# Patient Record
Sex: Male | Born: 1960 | Race: White | Hispanic: No | Marital: Married | Smoking: Former smoker
Health system: Southern US, Community
[De-identification: ages and names within clinical notes are randomized; demographics above are authoritative.]

## PROBLEM LIST (undated history)

## (undated) DIAGNOSIS — C801 Malignant (primary) neoplasm, unspecified: Secondary | ICD-10-CM

## (undated) DIAGNOSIS — I10 Essential (primary) hypertension: Secondary | ICD-10-CM

## (undated) DIAGNOSIS — Z9989 Dependence on other enabling machines and devices: Secondary | ICD-10-CM

## (undated) DIAGNOSIS — F4310 Post-traumatic stress disorder, unspecified: Secondary | ICD-10-CM

## (undated) DIAGNOSIS — K219 Gastro-esophageal reflux disease without esophagitis: Secondary | ICD-10-CM

## (undated) DIAGNOSIS — Z8709 Personal history of other diseases of the respiratory system: Secondary | ICD-10-CM

## (undated) DIAGNOSIS — C61 Malignant neoplasm of prostate: Secondary | ICD-10-CM

## (undated) DIAGNOSIS — S8990XA Unspecified injury of unspecified lower leg, initial encounter: Secondary | ICD-10-CM

## (undated) DIAGNOSIS — Z87820 Personal history of traumatic brain injury: Secondary | ICD-10-CM

## (undated) DIAGNOSIS — E785 Hyperlipidemia, unspecified: Secondary | ICD-10-CM

## (undated) DIAGNOSIS — Z0282 Encounter for adoption services: Secondary | ICD-10-CM

## (undated) DIAGNOSIS — T7840XA Allergy, unspecified, initial encounter: Secondary | ICD-10-CM

## (undated) DIAGNOSIS — Z973 Presence of spectacles and contact lenses: Secondary | ICD-10-CM

## (undated) HISTORY — PX: OTHER SURGICAL HISTORY: SHX169

## (undated) HISTORY — DX: Allergy, unspecified, initial encounter: T78.40XA

## (undated) HISTORY — DX: Essential (primary) hypertension: I10

## (undated) HISTORY — PX: POLYPECTOMY: SHX149

## (undated) HISTORY — PX: TONSILLECTOMY: SUR1361

## (undated) HISTORY — DX: Gastro-esophageal reflux disease without esophagitis: K21.9

## (undated) HISTORY — PX: WISDOM TOOTH EXTRACTION: SHX21

## (undated) HISTORY — PX: GANGLION CYST EXCISION: SHX1691

## (undated) HISTORY — DX: Hyperlipidemia, unspecified: E78.5

## (undated) HISTORY — DX: Malignant (primary) neoplasm, unspecified: C80.1

---
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
## 1998-12-16 ENCOUNTER — Encounter: Payer: Self-pay | Admitting: Endocrinology

## 1998-12-16 ENCOUNTER — Emergency Department (HOSPITAL_COMMUNITY): Admission: EM | Admit: 1998-12-16 | Discharge: 1998-12-16 | Payer: Self-pay | Admitting: Emergency Medicine

## 2009-01-10 DIAGNOSIS — Z8679 Personal history of other diseases of the circulatory system: Secondary | ICD-10-CM

## 2009-01-10 HISTORY — DX: Personal history of other diseases of the circulatory system: Z86.79

## 2009-04-15 ENCOUNTER — Emergency Department (HOSPITAL_COMMUNITY): Admission: EM | Admit: 2009-04-15 | Discharge: 2009-04-15 | Payer: Self-pay | Admitting: Emergency Medicine

## 2011-04-10 ENCOUNTER — Encounter: Payer: Self-pay | Admitting: Gastroenterology

## 2011-05-09 ENCOUNTER — Encounter: Payer: Self-pay | Admitting: Gastroenterology

## 2011-05-09 ENCOUNTER — Ambulatory Visit (AMBULATORY_SURGERY_CENTER): Payer: 59 | Admitting: *Deleted

## 2011-05-09 VITALS — Ht 72.0 in | Wt 215.0 lb

## 2011-05-09 DIAGNOSIS — Z1211 Encounter for screening for malignant neoplasm of colon: Secondary | ICD-10-CM

## 2011-05-09 MED ORDER — PEG-KCL-NACL-NASULF-NA ASC-C 100 G PO SOLR: ORAL | Status: DC

## 2011-05-23 ENCOUNTER — Encounter: Payer: Self-pay | Admitting: Gastroenterology

## 2011-05-23 ENCOUNTER — Ambulatory Visit (AMBULATORY_SURGERY_CENTER): Payer: 59 | Admitting: Gastroenterology

## 2011-05-23 DIAGNOSIS — D126 Benign neoplasm of colon, unspecified: Secondary | ICD-10-CM

## 2011-05-23 DIAGNOSIS — Z1211 Encounter for screening for malignant neoplasm of colon: Secondary | ICD-10-CM

## 2011-05-23 HISTORY — PX: COLONOSCOPY: SHX174

## 2011-05-23 MED ORDER — SODIUM CHLORIDE 0.9 % IV SOLN: 500.0000 mL | INTRAVENOUS | Status: DC

## 2011-05-23 NOTE — Patient Instructions (Signed)
Resume all medications.Information given on polyps.D/C Information completed.

## 2011-05-24 ENCOUNTER — Telehealth: Payer: Self-pay | Admitting: *Deleted

## 2011-05-24 NOTE — Telephone Encounter (Signed)
Follow up Call- Patient questions:  Do you have a fever, pain , or abdominal swelling? no Pain Score  0 *  Have you tolerated food without any problems? yes  Have you been able to return to your normal activities? yes  Do you have any questions about your discharge instructions: Diet   no Medications  no Follow up visit  no  Do you have questions or concerns about your Care? yes  Actions: * If pain score is 4 or above: No action needed, pain <4.  Patient states "having diarrhea this morning" he denies any other symptoms or problems,explained to patient this may be from prep but should not be excessive or last long time, explained to call us back if not better. He understands.

## 2011-05-25 ENCOUNTER — Encounter: Payer: Self-pay | Admitting: Gastroenterology

## 2011-05-25 ENCOUNTER — Other Ambulatory Visit: Payer: Self-pay | Admitting: Gastroenterology

## 2011-05-25 MED ORDER — HYOSCYAMINE SULFATE ER 0.375 MG PO TBCR: EXTENDED_RELEASE_TABLET | ORAL | Status: DC

## 2011-05-25 NOTE — Progress Notes (Signed)
  Patient called stating that he is having very mild right lower quadrant pain. 2 days ago he had a colonoscopy with cold removal of several small polyps. He is without fever. He denies any bleeding.  Patient was instructed to take I am asked as needed for pain. If pain is not improved or if it is worsening over the next 24 hours he is instructed to call back.

## 2011-08-26 ENCOUNTER — Ambulatory Visit (INDEPENDENT_AMBULATORY_CARE_PROVIDER_SITE_OTHER): Payer: 59 | Admitting: Emergency Medicine

## 2011-08-26 VITALS — BP 130/90 | HR 85 | Temp 98.4°F | Resp 18 | Ht 73.0 in | Wt 217.0 lb

## 2011-08-26 DIAGNOSIS — R5381 Other malaise: Secondary | ICD-10-CM

## 2011-08-26 DIAGNOSIS — R5383 Other fatigue: Secondary | ICD-10-CM

## 2011-08-26 DIAGNOSIS — R531 Weakness: Secondary | ICD-10-CM

## 2011-08-26 LAB — POCT CBC
Granulocyte percent: 62.6 %G (ref 37–80)
Lymph, poc: 3.3 (ref 0.6–3.4)
MCHC: 32 g/dL (ref 31.8–35.4)
MCV: 101.2 fL — AB (ref 80–97)
MID (cbc): 1.1 — AB (ref 0–0.9)
POC Granulocyte: 7.4 — AB (ref 2–6.9)
POC LYMPH PERCENT: 27.8 %L (ref 10–50)
Platelet Count, POC: 336 10*3/uL (ref 142–424)
RDW, POC: 13.8 %

## 2011-08-26 LAB — POCT URINALYSIS DIPSTICK
Bilirubin, UA: NEGATIVE
Blood, UA: NEGATIVE
Glucose, UA: NEGATIVE
Ketones, UA: NEGATIVE
Leukocytes, UA: NEGATIVE
Nitrite, UA: NEGATIVE
Protein, UA: NEGATIVE
Spec Grav, UA: 1.02
Urobilinogen, UA: 0.2
pH, UA: 5.5

## 2011-08-26 LAB — POCT SEDIMENTATION RATE: POCT SED RATE: 45 mm/hr — AB (ref 0–22)

## 2011-08-26 LAB — GLUCOSE, POCT (MANUAL RESULT ENTRY): POC Glucose: 81

## 2011-08-26 NOTE — Patient Instructions (Signed)
Stay hydrated.  Drink water, alternating with Gatorade.  Rest as much as you can.  Work note written for two days.  Do not take Atacand until we contact you with lab results tomorrow.  Nausea and Vomiting Nausea is a sick feeling that often comes before throwing up (vomiting). Vomiting is a reflex where stomach contents come out of your mouth. Vomiting can cause severe loss of body fluids (dehydration). Children and elderly adults can become dehydrated quickly, especially if they also have diarrhea. Nausea and vomiting are symptoms of a condition or disease. It is important to find the cause of your symptoms. CAUSES   Direct irritation of the stomach lining. This irritation can result from increased acid production (gastroesophageal reflux disease), infection, food poisoning, taking certain medicines (such as nonsteroidal anti-inflammatory drugs), alcohol use, or tobacco use.   Signals from the brain.These signals could be caused by a headache, heat exposure, an inner ear disturbance, increased pressure in the brain from injury, infection, a tumor, or a concussion, pain, emotional stimulus, or metabolic problems.   An obstruction in the gastrointestinal tract (bowel obstruction).   Illnesses such as diabetes, hepatitis, gallbladder problems, appendicitis, kidney problems, cancer, sepsis, atypical symptoms of a heart attack, or eating disorders.   Medical treatments such as chemotherapy and radiation.   Receiving medicine that makes you sleep (general anesthetic) during surgery.  DIAGNOSIS Your caregiver may ask for tests to be done if the problems do not improve after a few days. Tests may also be done if symptoms are severe or if the reason for the nausea and vomiting is not clear. Tests may include:  Urine tests.   Blood tests.   Stool tests.   Cultures (to look for evidence of infection).   X-rays or other imaging studies.  Test results can help your caregiver make decisions about  treatment or the need for additional tests. TREATMENT You need to stay well hydrated. Drink frequently but in small amounts.You may wish to drink water, sports drinks, clear broth, or eat frozen ice pops or gelatin dessert to help stay hydrated.When you eat, eating slowly may help prevent nausea.There are also some antinausea medicines that may help prevent nausea. HOME CARE INSTRUCTIONS   Take all medicine as directed by your caregiver.   If you do not have an appetite, do not force yourself to eat. However, you must continue to drink fluids.   If you have an appetite, eat a normal diet unless your caregiver tells you differently.   Eat a variety of complex carbohydrates (rice, wheat, potatoes, bread), lean meats, yogurt, fruits, and vegetables.   Avoid high-fat foods because they are more difficult to digest.   Drink enough water and fluids to keep your urine clear or pale yellow.   If you are dehydrated, ask your caregiver for specific rehydration instructions. Signs of dehydration may include:   Severe thirst.   Dry lips and mouth.   Dizziness.   Dark urine.   Decreasing urine frequency and amount.   Confusion.   Rapid breathing or pulse.  SEEK IMMEDIATE MEDICAL CARE IF:   You have blood or brown flecks (like coffee grounds) in your vomit.   You have black or bloody stools.   You have a severe headache or stiff neck.   You are confused.   You have severe abdominal pain.   You have chest pain or trouble breathing.   You do not urinate at least once every 8 hours.   You develop cold  or clammy skin.   You continue to vomit for longer than 24 to 48 hours.   You have a fever.  MAKE SURE YOU:   Understand these instructions.   Will watch your condition.   Will get help right away if you are not doing well or get worse.  Document Released: 07/01/2005 Document Revised: 03/13/2011 Document Reviewed: 11/28/2010 [facility] Patient Information 2012 [place],  [place].

## 2011-08-26 NOTE — Progress Notes (Signed)
  Subjective:    Patient ID: Jerome Newton, male    DOB: January 24, 1961, 51 y.o.   MRN: [id number]  HPI 51 y/o WM presents with multiple vague symptoms Fatigue and weakness ongoing for two weeks.  Slight cough one week ago. Last week developed nausea and vomiting.  Called primary MD who prescribed Zofran without appointment.  The following day had dry heaves but did not vomit.  Has not vomited now in several days, but suffering with constipation.  No abdominal pain.  No fevers or chills.    Now with dull headache, feels weak, lightheaded.  Slight chills, still no fever.  Cough rare, non-productive.  No nasal congestion, but with lots of post-nasal drip.  No GU symptoms other than usual (urinary hesitancy, weak stream d/t indolent prostate cancer).     Review of Systems  Constitutional: Positive for chills and fatigue. Negative for fever.  HENT: Positive for postnasal drip. Negative for ear pain, nosebleeds, congestion, sore throat, sneezing, neck pain and neck stiffness.   Respiratory: Positive for cough. Negative for shortness of breath and wheezing.   Cardiovascular: Negative for chest pain.  Gastrointestinal: Positive for nausea and constipation. Negative for abdominal pain, diarrhea and blood in stool.  Genitourinary: Positive for difficulty urinating (h/o indolent prostate CA, managed by urology). Negative for frequency and discharge.  Musculoskeletal: Positive for back pain (intermittent, non-radiating).  Skin: Positive for rash (chronic eczema, seborrheic dermatitis).  Neurological: Positive for light-headedness (upon standing).       Objective:   Physical Exam  Constitutional: He is oriented to person, place, and time. Vital signs are normal. He appears lethargic. He appears ill. No distress.  HENT:  Head: Normocephalic and atraumatic.  Right Ear: External ear normal.  Left Ear: External ear normal.  Mouth/Throat: Oropharynx is clear and moist. No oropharyngeal exudate.  Eyes:  Conjunctivae and EOM are normal. Pupils are equal, round, and reactive to light.  Neck: Normal range of motion. Neck supple.  Cardiovascular: Normal rate and regular rhythm.  Exam reveals no gallop and no friction rub.   No murmur heard. Pulmonary/Chest: Effort normal and breath sounds normal. No respiratory distress. He has no wheezes. He has no rales.  Abdominal: Soft. There is tenderness (mild, LLQ). There is no rebound and no guarding.  Lymphadenopathy:    He has no cervical adenopathy.  Neurological: He is oriented to person, place, and time. He appears lethargic.  Skin: Skin is warm. Rash (face with desquamated rash with underlying erythema, history of eczema and seborrheic dermatitis) noted.  Psychiatric: His affect is blunt.          Assessment & Plan:  Recent nausea and vomiting.  Resolved, but with residual weakness.  He is on an ACE-inhibitor and had recent GI illness, will screen CMET for abnormal renal function d/t pre-renal azotemia.  Will also run TSH and ESR d/t prolonged fatigue and weakness.  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  Note out of work for two days to recover.

## 2011-08-27 LAB — COMPREHENSIVE METABOLIC PANEL
BUN: 14 mg/dL (ref 6–23)
CO2: 26 mEq/L (ref 19–32)
Calcium: 10.2 mg/dL (ref 8.4–10.5)
Chloride: 102 mEq/L (ref 96–112)
Creat: 1 mg/dL (ref 0.50–1.35)
Glucose, Bld: 89 mg/dL (ref 70–99)
Total Bilirubin: 0.4 mg/dL (ref 0.3–1.2)

## 2011-08-27 LAB — TSH: TSH: 1.534 u[IU]/mL (ref 0.350–4.500)

## 2011-10-11 ENCOUNTER — Ambulatory Visit (INDEPENDENT_AMBULATORY_CARE_PROVIDER_SITE_OTHER): Payer: 59 | Admitting: Emergency Medicine

## 2011-10-11 ENCOUNTER — Ambulatory Visit: Payer: 59

## 2011-10-11 VITALS — BP 119/81 | HR 76 | Temp 98.1°F | Resp 16 | Ht 72.75 in | Wt 218.2 lb

## 2011-10-11 DIAGNOSIS — R509 Fever, unspecified: Secondary | ICD-10-CM

## 2011-10-11 DIAGNOSIS — R05 Cough: Secondary | ICD-10-CM

## 2011-10-11 DIAGNOSIS — J029 Acute pharyngitis, unspecified: Secondary | ICD-10-CM

## 2011-10-11 DIAGNOSIS — R110 Nausea: Secondary | ICD-10-CM

## 2011-10-11 DIAGNOSIS — J40 Bronchitis, not specified as acute or chronic: Secondary | ICD-10-CM

## 2011-10-11 LAB — POCT CBC
Lymph, poc: 2.2 (ref 0.6–3.4)
MCH, POC: 32.9 pg — AB (ref 27–31.2)
MCHC: 32.7 g/dL (ref 31.8–35.4)
MCV: 100.4 fL — AB (ref 80–97)
MID (cbc): 0.9 (ref 0–0.9)
POC LYMPH PERCENT: 21.6 %L (ref 10–50)
POC MID %: 8.2 %M (ref 0–12)
Platelet Count, POC: 362 10*3/uL (ref 142–424)
RBC: 4.41 M/uL — AB (ref 4.69–6.13)
RDW, POC: 13.9 %
WBC: 10.4 10*3/uL — AB (ref 4.6–10.2)

## 2011-10-11 MED ORDER — AZITHROMYCIN 250 MG PO TABS: ORAL_TABLET | ORAL | Status: AC

## 2011-10-11 MED ORDER — BENZONATATE 200 MG PO CAPS: 200.0000 mg | ORAL_CAPSULE | Freq: Three times a day (TID) | ORAL | Status: AC | PRN

## 2011-10-11 NOTE — Progress Notes (Signed)
  Subjective:    Patient ID: Jerome Newton, male    DOB: 10-Dec-1960, 51 y.o.   MRN: [id number]  HPIThe patient is a 51 year old male who started to feel bad on Tuesday with irritated throat and head congestion. He doesn't have a fever but complains he is nauseous and diarrhea. No vomiting, no ear pain, but prominent cough on exam. HE has tried OTC medicines with no success.      Review of Systems as related to the present illness     Objective:   Physical Exam  Constitutional: He appears well-developed and well-nourished.  HENT:  Right Ear: External ear normal.  Left Ear: External ear normal.       Patient is status post tonsillectomy. There is mild redness in the posterior pharynx.  Eyes: Pupils are equal, round, and reactive to light.  Neck: No tracheal deviation present. No thyromegaly present.  Cardiovascular: Normal rate, regular rhythm and normal heart sounds.   Pulmonary/Chest: Breath sounds normal. No stridor. No respiratory distress. He has no wheezes. He has no rales.  Abdominal: He exhibits no distension. There is no tenderness.  Lymphadenopathy:    He has no cervical adenopathy.    [facility] reading (PRIMARY) by  Dr.Laquashia Mergenthaler chest x-ray shows mild increased markings but no consolidated areas heart size normal. .       Assessment & Plan:  Patient has had a flu shot but has a flulike illness. We'll check a strep test CBC chest x-ray.

## 2011-11-21 ENCOUNTER — Ambulatory Visit (INDEPENDENT_AMBULATORY_CARE_PROVIDER_SITE_OTHER): Payer: 59 | Admitting: Emergency Medicine

## 2011-11-21 ENCOUNTER — Ambulatory Visit: Payer: 59

## 2011-11-21 VITALS — BP 122/75 | HR 71 | Temp 97.9°F | Resp 16 | Ht 73.0 in | Wt 218.0 lb

## 2011-11-21 DIAGNOSIS — Z8546 Personal history of malignant neoplasm of prostate: Secondary | ICD-10-CM

## 2011-11-21 DIAGNOSIS — J9801 Acute bronchospasm: Secondary | ICD-10-CM

## 2011-11-21 DIAGNOSIS — R059 Cough, unspecified: Secondary | ICD-10-CM

## 2011-11-21 DIAGNOSIS — E785 Hyperlipidemia, unspecified: Secondary | ICD-10-CM

## 2011-11-21 DIAGNOSIS — R05 Cough: Secondary | ICD-10-CM

## 2011-11-21 DIAGNOSIS — R918 Other nonspecific abnormal finding of lung field: Secondary | ICD-10-CM

## 2011-11-21 DIAGNOSIS — J159 Unspecified bacterial pneumonia: Secondary | ICD-10-CM

## 2011-11-21 DIAGNOSIS — I10 Essential (primary) hypertension: Secondary | ICD-10-CM

## 2011-11-21 LAB — POCT CBC
Granulocyte percent: 64.3 %G (ref 37–80)
Hemoglobin: 13.8 g/dL — AB (ref 14.1–18.1)
Lymph, poc: 2.5 (ref 0.6–3.4)
MCHC: 32.8 g/dL (ref 31.8–35.4)
MPV: 8.4 fL (ref 0–99.8)
POC Granulocyte: 6.2 (ref 2–6.9)
POC MID %: 9.5 %M (ref 0–12)

## 2011-11-21 MED ORDER — ALBUTEROL SULFATE (2.5 MG/3ML) 0.083% IN NEBU
2.5000 mg | INHALATION_SOLUTION | Freq: Once | RESPIRATORY_TRACT | Status: AC
  Administered 2011-11-21: 2.5 mg via RESPIRATORY_TRACT

## 2011-11-21 MED ORDER — AZITHROMYCIN 250 MG PO TABS: ORAL_TABLET | ORAL | Status: AC

## 2011-11-21 MED ORDER — MOMETASONE FURO-FORMOTEROL FUM 200-5 MCG/ACT IN AERO: 2.0000 | INHALATION_SPRAY | Freq: Two times a day (BID) | RESPIRATORY_TRACT | Status: DC

## 2011-11-21 MED ORDER — BENZONATATE 200 MG PO CAPS: 200.0000 mg | ORAL_CAPSULE | Freq: Two times a day (BID) | ORAL | Status: AC | PRN

## 2011-11-21 MED ORDER — ONDANSETRON HCL 4 MG PO TABS: 4.0000 mg | ORAL_TABLET | Freq: Three times a day (TID) | ORAL | Status: AC | PRN

## 2011-11-21 NOTE — Progress Notes (Signed)
  Subjective:    Patient ID: Jerome Newton, male    DOB: 1960/12/04, 51 y.o.   MRN: [id number]  HPI 51 yo CM c/o dry, hacky cough X 2 weeks that is mostly non-productive and is sometimes triggering his gag reflex and causing on and off nausea.  No night sweats. No f/c. No weight loss. No back or bone pain.   H/o prostate CA.  Last PSA was 6 months ago and he has appt at the end of this month.  Review of Systems  All other systems reviewed and are negative.      Objective:   Physical Exam  Nursing note and vitals reviewed. Constitutional: He is oriented to person, place, and time. He appears well-developed and well-nourished.  Neck: Normal range of motion. Neck supple.  Cardiovascular: Normal rate, regular rhythm and normal heart sounds.   Pulmonary/Chest: Effort normal. He has wheezes (wheezes B with forced end expiration).  Neurological: He is alert and oriented to person, place, and time.  Skin: Skin is warm and dry.  Psychiatric: He has a normal mood and affect. His behavior is normal.   Results for orders placed in visit on 11/21/11  POCT CBC      Component Value Range   WBC 9.6  4.6 - 10.2 (K/uL)   Lymph, poc 2.5  0.6 - 3.4    POC LYMPH PERCENT 26.2  10 - 50 (%L)   MID (cbc) 0.9  0 - 0.9    POC MID % 9.5  0 - 12 (%M)   POC Granulocyte 6.2  2 - 6.9    Granulocyte percent 64.3  37 - 80 (%G)   RBC 4.21 (*) 4.69 - 6.13 (M/uL)   Hemoglobin 13.8 (*) 14.1 - 18.1 (g/dL)   HCT, POC 46.9 (*) 62.9 - 53.7 (%)   MCV 99.9 (*) 80 - 97 (fL)   MCH, POC 32.8 (*) 27 - 31.2 (pg)   MCHC 32.8  31.8 - 35.4 (g/dL)   RDW, POC 52.8     Platelet Count, POC 337  142 - 424 (K/uL)   MPV 8.4  0 - 99.8 (fL)   [facility] reading (PRIMARY) by  Dr. Cleta Alberts as increased markings consistent with RLL and retrocardiac infiltrate.     Assessment & Plan:  Pneumonia-See Rxs Patient's cough did respond to albuterol.

## 2011-11-21 NOTE — Patient Instructions (Signed)
Fluids and rest.

## 2011-11-22 LAB — H. PYLORI ANTIBODY, IGG: H Pylori IgG: 0.78 {ISR}

## 2011-11-24 ENCOUNTER — Ambulatory Visit: Payer: 59

## 2011-11-24 ENCOUNTER — Ambulatory Visit (INDEPENDENT_AMBULATORY_CARE_PROVIDER_SITE_OTHER): Payer: 59 | Admitting: Family Medicine

## 2011-11-24 VITALS — BP 124/75 | HR 79 | Temp 98.0°F | Resp 16 | Ht 74.0 in | Wt 219.0 lb

## 2011-11-24 DIAGNOSIS — J45909 Unspecified asthma, uncomplicated: Secondary | ICD-10-CM

## 2011-11-24 DIAGNOSIS — J329 Chronic sinusitis, unspecified: Secondary | ICD-10-CM

## 2011-11-24 LAB — POCT CBC
Granulocyte percent: 73.9 %G (ref 37–80)
MCH, POC: 33.3 pg — AB (ref 27–31.2)
MCV: 100.2 fL — AB (ref 80–97)
MID (cbc): 1 — AB (ref 0–0.9)
MPV: 8.4 fL (ref 0–99.8)
POC LYMPH PERCENT: 20.1 %L (ref 10–50)
POC MID %: 6 %M (ref 0–12)
Platelet Count, POC: 384 10*3/uL (ref 142–424)
RDW, POC: 13.6 %

## 2011-11-24 MED ORDER — PREDNISONE 20 MG PO TABS: ORAL_TABLET | ORAL | Status: AC

## 2011-11-24 MED ORDER — CEFTRIAXONE SODIUM 1 G IJ SOLR
1.0000 g | INTRAMUSCULAR | Status: DC
  Administered 2011-11-24: 1 g via INTRAMUSCULAR

## 2011-11-24 MED ORDER — CEFDINIR 300 MG PO CAPS: 300.0000 mg | ORAL_CAPSULE | Freq: Two times a day (BID) | ORAL | Status: AC

## 2011-11-24 NOTE — Progress Notes (Signed)
Subjective: 51 year old white male with history of having had a respiratory tract infection for which he saw Dr. Andee Poles a few days ago. He was diagnosed as a possible early retrocardiac pneumonia. The radiologist however read this film is negative. Is not running a fever, but he is coughing and short of breath. He did work last night at his [employer], clinical he is supposed to work again [employer]. He only got a few hours of sleep this morning after getting home. He has been using the [facility] inhaler as well as taking his medications. He does not have a history of a lot of respiratory tract infections.  Objective: Somewhat ill-appearing man with a persistent cough. His throat is clear. Neck supple without nodes. Chest has decreased air movement no rales or rhonchi. No definite wheezing. Forced expiration gestures or cough however.  Assessment: Asthmatic bronchitis Cough  Plan: Check CBC and proceed from there Results for orders placed in visit on 11/24/11  POCT CBC      Component Value Range   WBC 16.8 (*) 4.6 - 10.2 (K/uL)   Lymph, poc 3.4  0.6 - 3.4    POC LYMPH PERCENT 20.1  10 - 50 (%L)   MID (cbc) 1.0 (*) 0 - 0.9    POC MID % 6.0  0 - 12 (%M)   POC Granulocyte 12.4 (*) 2 - 6.9    Granulocyte percent 73.9  37 - 80 (%G)   RBC 4.54 (*) 4.69 - 6.13 (M/uL)   Hemoglobin 15.1  14.1 - 18.1 (g/dL)   HCT, POC 96.0  45.4 - 53.7 (%)   MCV 100.2 (*) 80 - 97 (fL)   MCH, POC 33.3 (*) 27 - 31.2 (pg)   MCHC 33.2  31.8 - 35.4 (g/dL)   RDW, POC 09.8     Platelet Count, POC 384  142 - 424 (K/uL)   MPV 8.4  0 - 99.8 (fL)   [facility] reading (PRIMARY) by  Dr. Alwyn Ren cxr clear .   Will treat with antibiotics and prednisone.  Return if not better.

## 2011-11-24 NOTE — Patient Instructions (Signed)
Fluids, rest  Return if not improving in 3 days or return sooner or emergency room if worse

## 2011-11-26 ENCOUNTER — Encounter: Payer: Self-pay | Admitting: Radiology

## 2011-12-06 ENCOUNTER — Ambulatory Visit (INDEPENDENT_AMBULATORY_CARE_PROVIDER_SITE_OTHER): Payer: 59 | Admitting: Family Medicine

## 2011-12-06 VITALS — BP 114/70 | HR 78 | Temp 97.8°F | Resp 16 | Ht 72.5 in | Wt 215.0 lb

## 2011-12-06 DIAGNOSIS — R05 Cough: Secondary | ICD-10-CM

## 2011-12-06 DIAGNOSIS — R059 Cough, unspecified: Secondary | ICD-10-CM

## 2011-12-06 DIAGNOSIS — J309 Allergic rhinitis, unspecified: Secondary | ICD-10-CM

## 2011-12-06 DIAGNOSIS — K2100 Gastro-esophageal reflux disease with esophagitis, without bleeding: Secondary | ICD-10-CM

## 2011-12-06 DIAGNOSIS — R112 Nausea with vomiting, unspecified: Secondary | ICD-10-CM

## 2011-12-06 LAB — POCT CBC
Granulocyte percent: 74.8 %G (ref 37–80)
HCT, POC: 46 % (ref 43.5–53.7)
Hemoglobin: 15.3 g/dL (ref 14.1–18.1)
Lymph, poc: 2.7 (ref 0.6–3.4)
MCH, POC: 33.4 pg — AB (ref 27–31.2)
MCHC: 33.3 g/dL (ref 31.8–35.4)
MCV: 100.5 fL — AB (ref 80–97)
MID (cbc): 1.1 — AB (ref 0–0.9)
MPV: 8.8 fL (ref 0–99.8)
POC Granulocyte: 11.3 — AB (ref 2–6.9)
POC LYMPH PERCENT: 17.9 %L (ref 10–50)
POC MID %: 7.3 %M (ref 0–12)
Platelet Count, POC: 359 10*3/uL (ref 142–424)
RBC: 4.58 M/uL — AB (ref 4.69–6.13)
RDW, POC: 13.7 %
WBC: 15.1 10*3/uL — AB (ref 4.6–10.2)

## 2011-12-06 MED ORDER — OMEPRAZOLE 40 MG PO CPDR: 40.0000 mg | DELAYED_RELEASE_CAPSULE | Freq: Every day | ORAL | Status: DC

## 2011-12-06 MED ORDER — TRIAMCINOLONE ACETONIDE(NASAL) 55 MCG/ACT NA INHA: 2.0000 | Freq: Every day | NASAL | Status: AC

## 2011-12-06 NOTE — Progress Notes (Signed)
Subjective:    Patient ID: Jerome Newton, male    DOB: 27-Jan-1961, 51 y.o.   MRN: [id number]  HPI Patient presents for follow up on cough nausea and minimal emesis. He was here on 11/21/11 and treated for suspected pneumonia with a Z-pack, however overread determined negative CXR. At that time he was also taking Zofran and Tessalon perles, neither of which helped the patient's syptoms. He then presented on 11/24/11 for recheck of his symptoms which were not improving. At that time he had and elevated white count at 16.8 and another negative CXR. He was treated with Omnicef and prednisone, which he states did not help his symptoms. He is here today for follow up of the same symptoms, including dry cough,laryngitis, minimal emesis, and slight nausea. Denies fever, chills, or abdominal pain. Does have a history of allergic rhinitis which he takes Claritin daily and in the past has used Nasacort, although he has not used this in "years." Patient admits to history of heartburn for which he takes Tums as needed.   History of prostate cancer that is currently being monitored by Urologist. Had PSA drawn for appointment next week. Last PSA 6 months ago - stable.    Review of Systems  Constitutional: Negative for fever and chills.  HENT: Positive for rhinorrhea and postnasal drip. Negative for congestion and sore throat. Trouble swallowing: hoarse.   Respiratory: Positive for cough. Negative for chest tightness, shortness of breath and wheezing.   Gastrointestinal: Positive for nausea and vomiting (x 1 episode). Negative for abdominal pain and blood in stool.  Skin: Negative for rash.  Neurological: Negative for dizziness and light-headedness.       Objective:   Physical Exam  Constitutional: He is oriented to person, place, and time. He appears well-developed and well-nourished.  HENT:  Head: Normocephalic and atraumatic.  Right Ear: Tympanic membrane, external ear and ear canal normal.  Left Ear:  Tympanic membrane, external ear and ear canal normal.  Mouth/Throat: Uvula is midline, oropharynx is clear and moist and mucous membranes are normal. No oropharyngeal exudate.  Eyes: Conjunctivae are normal.  Neck: Neck supple.  Cardiovascular: Normal rate, regular rhythm and normal heart sounds.   Pulmonary/Chest: Effort normal and breath sounds normal. He has no wheezes.  Lymphadenopathy:    He has no cervical adenopathy.  Neurological: He is alert and oriented to person, place, and time.  Skin: Skin is warm and dry.  Psychiatric: He has a normal mood and affect. His behavior is normal. Judgment and thought content normal.     Results for orders placed in visit on 12/06/11  POCT CBC      Component Value Range   WBC 15.1 (*) 4.6 - 10.2 (K/uL)   Lymph, poc 2.7  0.6 - 3.4    POC LYMPH PERCENT 17.9  10 - 50 (%L)   MID (cbc) 1.1 (*) 0 - 0.9    POC MID % 7.3  0 - 12 (%M)   POC Granulocyte 11.3 (*) 2 - 6.9    Granulocyte percent 74.8  37 - 80 (%G)   RBC 4.58 (*) 4.69 - 6.13 (M/uL)   Hemoglobin 15.3  14.1 - 18.1 (g/dL)   HCT, POC 40.9  81.1 - 53.7 (%)   MCV 100.5 (*) 80 - 97 (fL)   MCH, POC 33.4 (*) 27 - 31.2 (pg)   MCHC 33.3  31.8 - 35.4 (g/dL)   RDW, POC 91.4     Platelet Count, POC 359  142 -  424 (K/uL)   MPV 8.8  0 - 99.8 (fL)   White count slightly lower than 11/24/11, however patient has been on prednisone.       Assessment & Plan:   1. Cough  Do no believe this cough is infectious. Will treat for likely reflux esophagitis with high dose omeprazole x 1 month.  Follow up if no improvement.  POCT CBC  2. Nausea & vomiting    3. Reflux esophagitis  omeprazole (PRILOSEC) 40 MG capsule  4. Allergic rhinitis  Will add Nasacort daily to treat allergic rhinitis. This could be contributing to his persistent cough.  Continue OTC Claritin daily  triamcinolone (NASACORT AQ) 55 MCG/ACT nasal inhaler

## 2012-10-26 ENCOUNTER — Ambulatory Visit (INDEPENDENT_AMBULATORY_CARE_PROVIDER_SITE_OTHER): Payer: 59 | Admitting: Family Medicine

## 2012-10-26 VITALS — BP 128/80 | HR 88 | Temp 97.9°F | Resp 18 | Ht 74.0 in | Wt 224.0 lb

## 2012-10-26 DIAGNOSIS — R059 Cough, unspecified: Secondary | ICD-10-CM

## 2012-10-26 DIAGNOSIS — J209 Acute bronchitis, unspecified: Secondary | ICD-10-CM

## 2012-10-26 DIAGNOSIS — R05 Cough: Secondary | ICD-10-CM

## 2012-10-26 MED ORDER — HYDROCODONE-HOMATROPINE 5-1.5 MG/5ML PO SYRP: 5.0000 mL | ORAL_SOLUTION | Freq: Four times a day (QID) | ORAL | Status: DC | PRN

## 2012-10-26 MED ORDER — AZITHROMYCIN 250 MG PO TABS: ORAL_TABLET | ORAL | Status: DC

## 2012-10-26 NOTE — Patient Instructions (Addendum)

## 2012-10-26 NOTE — Progress Notes (Signed)
Patient ID: DIAR BERKEL MRN: [id number], DOB: 1960/07/20, 52 y.o. Date of Encounter: 10/26/2012, 9:02 AM  Primary Physician: Ezequiel Kayser, MD  Chief Complaint:  Chief Complaint  Patient presents with  . Cough    1 month    HPI: 52 y.o. year old male presents with a 54 day history of nasal congestion, post nasal drip, sore throat, and cough. Mild sinus pressure. Afebrile. No chills. Nasal congestion thick and green/yellow. Cough is productive of green/yellow sputum and not associated with time of day. Ears feel full, leading to sensation of muffled hearing. Has tried OTC cold preps without success. No GI complaints.   No sick contacts, recent antibiotics, or recent travels.   No leg trauma, sedentary periods, h/o cancer, or tobacco use.  Past Medical History  Diagnosis Date  . Allergy     hay fever  . Hyperlipidemia   . Hypertension   . Cancer ~2008    prostate     Home Meds: Prior to Admission medications   Medication Sig Start Date End Date Taking? Authorizing Provider  lisinopril (PRINIVIL,ZESTRIL) 20 MG tablet Take 20 mg by mouth daily.   Yes Historical Provider, MD  pravastatin (PRAVACHOL) 20 MG tablet Take 20 mg by mouth daily.   Yes Historical Provider, MD  triamcinolone (NASACORT AQ) 55 MCG/ACT nasal inhaler Place 2 sprays into the nose daily. 12/06/11 12/05/12 Yes Heather M Marte, PA-C  acetaminophen (TYLENOL) 325 MG tablet Take 650 mg by mouth every 6 (six) hours as needed.      Historical Provider, MD  ATACAND 16 MG tablet Take 1 tablet by mouth Daily. 04/30/11   Historical Provider, MD  azithromycin (ZITHROMAX) 250 MG tablet Take 2 today, one a day for 4 days 10/26/12   Elvina Sidle, MD  Benzonatate (TESSALON PERLES PO) Take by mouth.    Historical Provider, MD  HYDROcodone-homatropine (HYDROMET) 5-1.5 MG/5ML syrup Take 5 mLs by mouth every 6 (six) hours as needed for cough. 10/26/12   Elvina Sidle, MD  ibuprofen (ADVIL,MOTRIN) 400 MG tablet Take 400 mg  by mouth every 6 (six) hours as needed.      Historical Provider, MD  Mometasone Furo-Formoterol Fum 200-5 MCG/ACT AERO Inhale 2 puffs into the lungs 2 (two) times daily. 11/21/11   Anders Simmonds, PA-C  omeprazole (PRILOSEC) 40 MG capsule Take 1 capsule (40 mg total) by mouth daily. 12/06/11 12/05/12  Nelva Nay, PA-C  rosuvastatin (CRESTOR) 10 MG tablet Take 10 mg by mouth daily.      Historical Provider, MD    Allergies: No Known Allergies  History   Social History  . Marital Status: Married    Spouse Name: N/A    Number of Children: N/A  . Years of Education: N/A   Occupational History  . Not on file.   Social History Main Topics  . Smoking status: Current Some Day Smoker -- 0.25 packs/day    Types: Pipe, Cigars  . Smokeless tobacco: Not on file  . Alcohol Use: No     Comment: rarely  . Drug Use: No  . Sexually Active: Not on file   Other Topics Concern  . Not on file   Social History Narrative  . No narrative on file     Review of Systems: Constitutional: negative for chills, fever, night sweats or weight changes Cardiovascular: negative for chest pain or palpitations Respiratory: negative for hemoptysis, wheezing, or shortness of breath Abdominal: negative for abdominal pain, nausea, vomiting or diarrhea  Dermatological: negative for rash Neurologic: negative for headache   Physical Exam: Blood pressure 128/80, pulse 88, temperature 97.9 F (36.6 C), temperature source Oral, resp. rate 18, height 6\' 2"  (1.88 m), weight 224 lb (101.606 kg), SpO2 98.00%., Body mass index is 28.75 kg/(m^2). General: Well developed, well nourished, in no acute distress. Head: Normocephalic, atraumatic, eyes without discharge, sclera non-icteric, nares are congested. Bilateral auditory canals clear, TM's are without perforation, pearly grey with reflective cone of light bilaterally. No sinus TTP. Oral cavity moist, dentition normal. Posterior pharynx with post nasal drip and mild  erythema. No peritonsillar abscess or tonsillar exudate. Neck: Supple. No thyromegaly. Full ROM. No lymphadenopathy. Lungs: Coarse breath sounds bilaterally without wheezes, rales. Breathing is unlabored. Bilateral ronchi present Heart: RRR with S1 S2. No murmurs, rubs, or gallops appreciated. Msk:  Strength and tone normal for age. Extremities: No clubbing or cyanosis. No edema. Neuro: Alert and oriented X 3. Moves all extremities spontaneously. CNII-XII grossly in tact. Psych:  Responds to questions appropriately with a normal affect.    ASSESSMENT AND PLAN:  52 y.o. year old male with bronchitis. -Cough - Plan: azithromycin (ZITHROMAX) 250 MG tablet, HYDROcodone-homatropine (HYDROMET) 5-1.5 MG/5ML syrup  Acute bronchitis - Plan: azithromycin (ZITHROMAX) 250 MG tablet, HYDROcodone-homatropine (HYDROMET) 5-1.5 MG/5ML syrup   -Mucinex -Tylenol/Motrin prn -Rest/fluids -RTC precautions -RTC 3-5 days if no improvement  Signed, Elvina Sidle, MD 10/26/2012 9:02 AM

## 2013-11-18 ENCOUNTER — Ambulatory Visit (INDEPENDENT_AMBULATORY_CARE_PROVIDER_SITE_OTHER): Payer: 59 | Admitting: Physician Assistant

## 2013-11-18 VITALS — BP 138/86 | HR 83 | Temp 98.3°F | Resp 18 | Ht 74.0 in | Wt 224.0 lb

## 2013-11-18 DIAGNOSIS — S90569A Insect bite (nonvenomous), unspecified ankle, initial encounter: Secondary | ICD-10-CM

## 2013-11-18 DIAGNOSIS — S70362A Insect bite (nonvenomous), left thigh, initial encounter: Secondary | ICD-10-CM

## 2013-11-18 DIAGNOSIS — W57XXXA Bitten or stung by nonvenomous insect and other nonvenomous arthropods, initial encounter: Principal | ICD-10-CM

## 2013-11-18 NOTE — Progress Notes (Signed)
   Subjective:    Patient ID: Jerome Newton, male    DOB: Dec 24, 1960, 53 y.o.   MRN: [id number]  HPI   Jerome Newton is a very pleasant 53 yr old male here with tick bite of left thigh.  Noticed the tick this AM - attempted to remove, but mouthparts still attached.  He is not sure how long the tick was attached, but "not more than a day or two."  He has no surrounding erythema or induration.  He has otherwise been feeling well.  No fever, chills, aches.  Has been working outside, clearing brush.  Third tick this week.  Pt reports this was a [place] tick - had large white spot on back   Review of Systems  Constitutional: Negative for fever, chills and fatigue.  HENT: Negative.   Respiratory: Negative.   Cardiovascular: Negative.   Gastrointestinal: Negative.   Musculoskeletal: Negative for arthralgias and myalgias.  Skin: Negative for rash.  Neurological: Negative for headaches.       Objective:   Physical Exam  Vitals reviewed. Constitutional: He is oriented to person, place, and time. He appears well-developed and well-nourished. No distress.  HENT:  Head: Normocephalic and atraumatic.  Eyes: Conjunctivae are normal. No scleral icterus.  Pulmonary/Chest: Effort normal.  Neurological: He is alert and oriented to person, place, and time.  Skin: Skin is warm and dry.     Tick bite LEFT medial thigh; mouthparts still attached; No surrounding erythema, induration, warmth  Psychiatric: He has a normal mood and affect. His behavior is normal.    Procedure Note; Attempted removal of tick mouthparts with forceps, which was unsuccessful.  Local anesthesia with 0.5cc 2% lidocaine with epinephrine.  Mouthparts subsequently removed with tip of 11 blade.      Assessment & Plan:  Tick bite of left thigh   Jerome Newton is a very pleasant 53 yr old male here with tick bite of the LEFT medial thigh.  Mouthparts removed as above.  There is no surrounding erythema or rash.  Pt believes tick  to have been attached less than 48 hours.  Encouraged pt to keep area clean and dry.  Discussed RTC precautions including redness, swelling, drainage, fever, etc  Pt to call or RTC if worsening or not improving  E. Natividad Brood MHS, PA-C [facility] 5/7/201511:37 AM

## 2013-11-18 NOTE — Patient Instructions (Signed)
Keep the area clean and dry  If you notice redness, swelling, or drainage from the tick bite - please come back in  Additionally if you start feeling ill - fever, chills, headache, body aches - please come back   Tick Bite Information Ticks are insects that attach themselves to the skin and draw blood for food. There are various types of ticks. Common types include wood ticks and deer ticks. Most ticks live in shrubs and grassy areas. Ticks can climb onto your body when you make contact with leaves or grass where the tick is waiting. The most common places on the body for ticks to attach themselves are the scalp, neck, armpits, waist, and groin. Most tick bites are harmless, but sometimes ticks carry germs that cause diseases. These germs can be spread to a person during the tick's feeding process. The chance of a disease spreading through a tick bite depends on:   The type of tick.  Time of year.   How long the tick is attached.   Geographic location.  HOW CAN YOU PREVENT TICK BITES? Take these steps to help prevent tick bites when you are outdoors:  Wear protective clothing. Long sleeves and long pants are best.   Wear white clothes so you can see ticks more easily.  Tuck your pant legs into your socks.   If walking on a trail, stay in the middle of the trail to avoid brushing against bushes.  Avoid walking through areas with long grass.  Put insect repellent on all exposed skin and along boot tops, pant legs, and sleeve cuffs.   Check clothing, hair, and skin repeatedly and before going inside.   Brush off any ticks that are not attached.  Take a shower or bath as soon as possible after being outdoors.  WHAT IS THE PROPER WAY TO REMOVE A TICK? Ticks should be removed as soon as possible to help prevent diseases caused by tick bites. 1. If latex gloves are available, put them on before trying to remove a tick.  2. Using fine-point tweezers, grasp the tick as  close to the skin as possible. You may also use curved forceps or a tick removal tool. Grasp the tick as close to its head as possible. Avoid grasping the tick on its body. 3. Pull gently with steady upward pressure until the tick lets go. Do not twist the tick or jerk it suddenly. This may break off the tick's head or mouth parts. 4. Do not squeeze or crush the tick's body. This could force disease-carrying fluids from the tick into your body.  5. After the tick is removed, wash the bite area and your hands with soap and water or other disinfectant such as alcohol. 6. Apply a small amount of antiseptic cream or ointment to the bite site.  7. Wash and disinfect any instruments that were used.  Do not try to remove a tick by applying a hot match, petroleum jelly, or fingernail polish to the tick. These methods do not work and may increase the chances of disease being spread from the tick bite.  WHEN SHOULD YOU SEEK MEDICAL CARE? Contact your health care provider if you are unable to remove a tick from your skin or if a part of the tick breaks off and is stuck in the skin.  After a tick bite, you need to be aware of signs and symptoms that could be related to diseases spread by ticks. Contact your health care provider if you  develop any of the following in the days or weeks after the tick bite:  Unexplained fever.  Rash. A circular rash that appears days or weeks after the tick bite may indicate the possibility of Lyme disease. The rash may resemble a target with a bull's-eye and may occur at a different part of your body than the tick bite.  Redness and swelling in the area of the tick bite.   Tender, swollen lymph glands.   Diarrhea.   Weight loss.   Cough.   Fatigue.   Muscle, joint, or bone pain.   Abdominal pain.   Headache.   Lethargy or a change in your level of consciousness.  Difficulty walking or moving your legs.   Numbness in the legs.    Paralysis.  Shortness of breath.   Confusion.   Repeated vomiting.  Document Released: 06/28/2000 Document Revised: 04/21/2013 Document Reviewed: 12/09/2012 [facility] Patient Information 2014 [place].

## 2013-12-22 ENCOUNTER — Ambulatory Visit (INDEPENDENT_AMBULATORY_CARE_PROVIDER_SITE_OTHER): Payer: 59 | Admitting: Emergency Medicine

## 2013-12-22 VITALS — BP 126/71 | HR 71 | Temp 97.8°F | Resp 18 | Ht 72.0 in | Wt 227.8 lb

## 2013-12-22 DIAGNOSIS — Z0000 Encounter for general adult medical examination without abnormal findings: Secondary | ICD-10-CM

## 2013-12-22 NOTE — Progress Notes (Signed)
   Subjective:    Patient ID: Jerome Newton, male    DOB: [birth year], 53 y.o.   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   History of T&A, ganglion cyst removal and hypertension. PCP: Dr. [name].   Review of Systems patient also has a history of prostate cancer and has to see Dr. [name]  every 6 months for this. He recently had a prostate biopsy     Objective:   Physical Exam ENT exam is unremarkable. Neck supple without adenopathy chest clear heart regular rate no murmurs abdomen soft nontender extremity exam reveals no swelling of any joints. He does have a mild scoliotic curve. Neurological is intact.        Assessment & Plan:  Patient has a history of hypertension and reflux. And hyperlipidemia He is cleared for work. 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 He is 53 years old.

## 2016-05-01 ENCOUNTER — Encounter: Payer: Self-pay | Admitting: Gastroenterology

## 2019-01-20 DIAGNOSIS — Z8616 Personal history of COVID-19: Secondary | ICD-10-CM

## 2019-01-20 HISTORY — DX: Personal history of COVID-19: Z86.16

## 2019-02-19 ENCOUNTER — Encounter: Payer: Self-pay | Admitting: Gastroenterology

## 2019-03-17 ENCOUNTER — Ambulatory Visit (AMBULATORY_SURGERY_CENTER): Payer: Self-pay | Admitting: *Deleted

## 2019-03-17 ENCOUNTER — Other Ambulatory Visit: Payer: Self-pay

## 2019-03-17 VITALS — Temp 96.6°F | Ht 72.0 in | Wt 228.4 lb

## 2019-03-17 DIAGNOSIS — Z86010 Personal history of colonic polyps: Secondary | ICD-10-CM

## 2019-03-17 MED ORDER — PEG 3350-KCL-NA BICARB-NACL 420 G PO SOLR: 4000.0000 mL | Freq: Once | ORAL | 0 refills | Status: AC

## 2019-03-17 NOTE — Progress Notes (Signed)
No egg or soy allergy known to patient  No issues with past sedation with any surgeries  or procedures, no intubation problems  No diet pills per patient No home 02 use per patient  No blood thinners per patient  Pt denies issues with constipation  No A fib or A flutter  EMMI video sent to pt's e mail  

## 2019-03-31 ENCOUNTER — Encounter: Payer: Self-pay | Admitting: Gastroenterology

## 2020-10-31 ENCOUNTER — Other Ambulatory Visit: Payer: Self-pay | Admitting: Urology

## 2020-10-31 DIAGNOSIS — R972 Elevated prostate specific antigen [PSA]: Secondary | ICD-10-CM

## 2020-11-23 ENCOUNTER — Ambulatory Visit
Admission: RE | Admit: 2020-11-23 | Discharge: 2020-11-23 | Disposition: A | Discharge status: Home / Self Care | Payer: No Typology Code available for payment source | Source: Ambulatory Visit | Attending: Urology | Admitting: Urology

## 2020-11-23 DIAGNOSIS — R972 Elevated prostate specific antigen [PSA]: Secondary | ICD-10-CM

## 2020-11-23 MED ORDER — GADOBENATE DIMEGLUMINE 529 MG/ML IV SOLN
20.0000 mL | Freq: Once | INTRAVENOUS | Status: AC | PRN
  Administered 2020-11-23: 20 mL via INTRAVENOUS

## 2020-12-13 ENCOUNTER — Other Ambulatory Visit (HOSPITAL_COMMUNITY): Payer: Self-pay | Admitting: Urology

## 2020-12-13 DIAGNOSIS — R590 Localized enlarged lymph nodes: Secondary | ICD-10-CM

## 2020-12-13 DIAGNOSIS — C61 Malignant neoplasm of prostate: Secondary | ICD-10-CM

## 2020-12-13 DIAGNOSIS — R972 Elevated prostate specific antigen [PSA]: Secondary | ICD-10-CM

## 2021-01-02 ENCOUNTER — Encounter (INDEPENDENT_AMBULATORY_CARE_PROVIDER_SITE_OTHER): Payer: Self-pay

## 2021-01-02 ENCOUNTER — Other Ambulatory Visit: Payer: Self-pay

## 2021-01-02 ENCOUNTER — Ambulatory Visit (HOSPITAL_COMMUNITY)
Admission: RE | Admit: 2021-01-02 | Discharge: 2021-01-02 | Disposition: A | Discharge status: Home / Self Care | Payer: Self-pay | Source: Ambulatory Visit | Attending: Urology | Admitting: Urology

## 2021-01-02 DIAGNOSIS — R590 Localized enlarged lymph nodes: Secondary | ICD-10-CM | POA: Insufficient documentation

## 2021-01-02 DIAGNOSIS — R972 Elevated prostate specific antigen [PSA]: Secondary | ICD-10-CM | POA: Insufficient documentation

## 2021-01-02 DIAGNOSIS — C61 Malignant neoplasm of prostate: Secondary | ICD-10-CM | POA: Insufficient documentation

## 2021-01-02 MED ORDER — PIFLIFOLASTAT F 18 (PYLARIFY) INJECTION
9.0000 | Freq: Once | INTRAVENOUS | Status: AC
  Administered 2021-01-02: 9.09 via INTRAVENOUS

## 2021-01-09 ENCOUNTER — Ambulatory Visit
Admission: RE | Admit: 2021-01-09 | Discharge: 2021-01-09 | Disposition: A | Discharge status: Home / Self Care | Payer: No Typology Code available for payment source | Source: Ambulatory Visit | Attending: Radiation Oncology | Admitting: Radiation Oncology

## 2021-01-09 ENCOUNTER — Encounter: Payer: Self-pay | Admitting: Medical Oncology

## 2021-01-09 ENCOUNTER — Other Ambulatory Visit: Payer: Self-pay

## 2021-01-09 ENCOUNTER — Inpatient Hospital Stay: Payer: No Typology Code available for payment source | Attending: Oncology | Admitting: Oncology

## 2021-01-09 VITALS — BP 127/93 | HR 102 | Temp 97.6°F | Resp 18 | Ht 72.0 in | Wt 241.0 lb

## 2021-01-09 DIAGNOSIS — C61 Malignant neoplasm of prostate: Secondary | ICD-10-CM | POA: Insufficient documentation

## 2021-01-09 DIAGNOSIS — Z888 Allergy status to other drugs, medicaments and biological substances status: Secondary | ICD-10-CM | POA: Insufficient documentation

## 2021-01-09 DIAGNOSIS — N62 Hypertrophy of breast: Secondary | ICD-10-CM | POA: Insufficient documentation

## 2021-01-09 DIAGNOSIS — I700 Atherosclerosis of aorta: Secondary | ICD-10-CM | POA: Insufficient documentation

## 2021-01-09 DIAGNOSIS — K8020 Calculus of gallbladder without cholecystitis without obstruction: Secondary | ICD-10-CM | POA: Insufficient documentation

## 2021-01-09 DIAGNOSIS — K760 Fatty (change of) liver, not elsewhere classified: Secondary | ICD-10-CM | POA: Insufficient documentation

## 2021-01-09 DIAGNOSIS — Z8546 Personal history of malignant neoplasm of prostate: Secondary | ICD-10-CM | POA: Insufficient documentation

## 2021-01-09 DIAGNOSIS — K219 Gastro-esophageal reflux disease without esophagitis: Secondary | ICD-10-CM | POA: Insufficient documentation

## 2021-01-09 DIAGNOSIS — K4090 Unilateral inguinal hernia, without obstruction or gangrene, not specified as recurrent: Secondary | ICD-10-CM | POA: Insufficient documentation

## 2021-01-09 DIAGNOSIS — N400 Enlarged prostate without lower urinary tract symptoms: Secondary | ICD-10-CM | POA: Insufficient documentation

## 2021-01-09 DIAGNOSIS — K7689 Other specified diseases of liver: Secondary | ICD-10-CM | POA: Insufficient documentation

## 2021-01-09 DIAGNOSIS — Z79899 Other long term (current) drug therapy: Secondary | ICD-10-CM | POA: Insufficient documentation

## 2021-01-09 DIAGNOSIS — I10 Essential (primary) hypertension: Secondary | ICD-10-CM | POA: Insufficient documentation

## 2021-01-09 DIAGNOSIS — Z9079 Acquired absence of other genital organ(s): Secondary | ICD-10-CM | POA: Insufficient documentation

## 2021-01-09 DIAGNOSIS — E785 Hyperlipidemia, unspecified: Secondary | ICD-10-CM | POA: Insufficient documentation

## 2021-01-09 DIAGNOSIS — R590 Localized enlarged lymph nodes: Secondary | ICD-10-CM | POA: Insufficient documentation

## 2021-01-09 NOTE — Consult Note (Signed)
Multi-Disciplinary Clinic     01/09/2021   --------------------------------------------------------------------------------   Jerome Newton  MRN: [id number]  DOB: 09/25/1960, 60 year old Male  SSN: -**-[id number] Newton   PRIMARY CARE:  Jerome A. [name], MD  REFERRING:    PROVIDER:  Irine Newton, M.D.  TREATING:  Jerome Newton, M.D.  LOCATION:  [facility] [phone]     --------------------------------------------------------------------------------   CC/HPI: CC: Prostate Cancer   Physician requesting consult: Dr. Irine Newton  PCP: Dr. Crist Newton  Location of consult: [facility] - Prostate Cancer Multidisciplinary Clinic   Jerome Newton is a 60 year old gentleman who was initially diagnosed with prostate cancer by Dr. Kathie Newton in September of 2009 at age 60. At that time, his PSA was 4.23 and his initial prostate biopsy demonstrated Gleason 3+3=6 adenocarcinoma in 2 out of 12 biopsy cores. After discussing options, he elected an initial approach of active surveillance. His next biopsy was performed on 01/12/12 when his PSA was 4.57. This confirmed Gleason 3+3=6 adenocarcinoma in 1 out of 12 biopsy cores. His PSA was 4.9 in 2015 and he underwent a 3rd biopsy that again demonstrated Gleason 3+3=6 disease in 2 out of 12 cores. His PSA jumped to 13.43 in 2020 prompting his 4th biopsy which demonstrated Gleason 3+3=6 disease again but now in 4 out of 12 biopsy cores. He did not then follow up until the spring of 2022 when he established care with Dr. Jeffie Newton. His PSA was noted to 17.7 when checked by his PCP and was confirmed to be 15.2 in April 2022 when rechecked by Dr. Jeffie Newton. He underwent an MRI of the prostate on 11/23/20. This indicated a 15 mm PI-RADS 3 lesion in the right lateral mid transition zone gland and a 7 mm PI-RADS 3 lesion in the right posterolateral mid peripheral zone gland. In addition, he was noted to have small, but multiple bilateral pelvic lymph nodes  with a few measuring up to 11-12 mm. Due to this concern, a PSMA PET scan was obtained on 01/02/21. This indicated mild uptake in these lymph nodes (up to 3.3 SUV) with an area of uptake on the left side of the prostate (5.9 SUV) that did not correlate to his MRI lesions.   Family history: None.   Imaging studies: As above.   PMH: He has a history of asthma, hypertension, hyperlipidemia.  PSH: No abdominal surgeries.   TNM stage: cT1c N0-1 Mx  PSA: 15.2  Gleason score: 3+3=6 (GG 1)   Urinary function: IPSS is 15.  Erectile function: SHIM score is 19. He estimates that he can get an adequate erection for intercourse approximately 70% of the time. He does not take medication.     ALLERGIES: No Allergies    MEDICATIONS: Albuterol Sulfate Hfa 90 mcg hfa aerosol with adapter  Benzonatate 200 mg capsule Oral PRN  Claritin 10 MG Oral Tablet Oral  Daily Multiple Vitamin tablet Oral  Fish Oil 1000 MG Oral Capsule Oral  Garlic  Ginkgo  Lisinopril 20 MG Oral Tablet Oral  L-Lysine  Nasacort Aq 55 mcg aerosol, spray Nasal  Pravastatin Sodium 20 MG Oral Tablet Oral  Red Yeast Rice CAPS Oral     GU PSH: Prostate Needle Biopsy - 03/18/2019, 2009       [place] Notes: Biopsy Of The Prostate Needle, Tonsillectomy   NON-GU PSH: Remove Tonsils - 2009 Surgical Pathology, Gross And Microscopic Examination For Prostate Needle - 03/18/2019     GU  PMH: Elevated PSA - 12/04/2020, - 10/30/2020 Prostate Cancer, He has a history of Gleason 6 prostate cancer on surveillance and has 2 moderate risk lesions on MRI but more concerning is the presence of adenopathy on the MRI. After reviewing the results, I will get a PSMA PET try to determine whether the nodes are associated with the prostate cancer. He has f/u scheduled in August but further evaluation and treatment will be based on the PET. - 12/04/2020, Jerome Newton has a history of T1c Nx Mx Gleason 6 prostate cancer with a rising PSA that is up to 17.7 from 13.4 in  2018-10-14. He had a respiratory illness for a week prior to his last lab draw so I will get a repeat PSA with a week of abstinence to make sure the illness didn't impact the PSA. I will also get him scheduled for a prostate MRI. I discussed the implications of the rising PSA and the possible progression of his prostate cancer but he seemed most interested in continuing surveillance despite my concerns. I will have him return in 6 months with a PSA unless the repeat PSA and MRI suggest a need to do further biopsies or treatment. , - 10/30/2020 (Stable), Return in 6 months for repeat DRE and PSA., - 03/25/2019 (Stable), He did not pick up his preop antibiotics so he received a g of Rocephin and I sent in a single dose for him to take the soon as he leaves today., - 03/18/2019 (Worsening), We discussed the fact that his PSA is up further and therefore my recommendation, since it has been 3 years since his last biopsy, is to proceed with a prostate biopsy. He understands but due to financial reasons he indicated he did not want to have this performed until the 1st of the year. I did make him aware that there was risk in waiting. He said he was aware of this and assumed the risk. I will check a PSA again in 6 months and we will plan to proceed with repeat prostate biopsy after the 1st of the year., 13-Oct-2017 (Stable), His prostate was noted to be smooth and benign to exam. He has a stable creatinine at 6.72. Continue active surveillance., 2016-10-13 (Stable), His prostate remains benign to exam. His most recent PSA last month at 7.17 is up slightly from where it was previously but it has been as high 2 years ago. I recommended continued active surveillance at this point. He will return in 6 months for repeat DRE and PSA. He then gets his PSA done by Dr. Joylene Newton in 12 months., 10/13/2016, Adenocarcinoma of prostate, - Oct 14, 2014 BPH w/LUTS, He has moderate LUTS with minimal bother. I discussed considering finasteride which could help with the LUTS  and possibly slow down the low grade cancer, but he is not interested in pursuing that. - 10/30/2020 Weak Urinary Stream - 10/30/2020 BPH w/o LUTS, Benign enlargement of prostate - 10/13/13 Stress Incontinence, Male stress incontinence - 2013-10-13      PMH Notes:  2008-04-08 12:34:35 - Note: Cyst Of The Prostate   NON-GU PMH: Localized enlarged lymph nodes - 12/04/2020 Encounter for general adult medical examination without abnormal findings, Encounter for preventive health examination - 10/14/2014 Personal history of other diseases of the circulatory system, History of hypertension - 10/13/12 Personal history of other endocrine, nutritional and metabolic disease, History of hypercholesterolemia - 13-Oct-2012 Sebaceous cyst, Sebaceous cyst - 10/13/12    FAMILY HISTORY: Death In The Family Father - Runs In  Family No pertinent family history - Other   SOCIAL HISTORY: Marital Status: Married Preferred Language: English; Ethnicity: Not Hispanic Or Latino; Race: White Current Smoking Status: Patient has never smoked.   Tobacco Use Assessment Completed: Used Tobacco in last 30 days? Light Drinker.  Patient's occupation is/was unemployed.     Notes: Former smoker, Alcohol Use, Caffeine Use, Marital History - Currently Married, Tobacco Use, Occupation:   REVIEW OF SYSTEMS:    GU Review Male:   Patient denies frequent urination, hard to postpone urination, burning/ pain with urination, get up at night to urinate, leakage of urine, stream starts and stops, trouble starting your streams, and have to strain to urinate .  Gastrointestinal (Upper):   Patient denies nausea and vomiting.  Gastrointestinal (Lower):   Patient denies diarrhea and constipation.  Constitutional:   Patient denies night sweats, fatigue, fever, and weight loss.  Skin:   Patient denies skin rash/ lesion and itching.  Eyes:   Patient denies blurred vision and double vision.  Ears/ Nose/ Throat:   Patient denies sore throat and sinus problems.   Hematologic/Lymphatic:   Patient denies swollen glands and easy bruising.  Cardiovascular:   Patient denies leg swelling and chest pains.  Respiratory:   Patient denies cough and shortness of breath.  Endocrine:   Patient denies excessive thirst.  Musculoskeletal:   Patient denies back pain and joint pain.  Neurological:   Patient denies headaches and dizziness.  Psychologic:   Patient denies depression and anxiety.   VITAL SIGNS: None   MULTI-SYSTEM PHYSICAL EXAMINATION:    Constitutional: Well-nourished. No physical deformities. Normally developed. Good grooming.     Complexity of Data:  Lab Test Review:   PSA  Records Review:   Pathology Reports, Previous Patient Records  X-Ray Review: PET Scan: Reviewed Films.  MRI Prostate GSORAD: Reviewed Films.     11/06/20 10/04/20 01/25/19 05/28/18 11/19/17 11/06/17 03/20/17 09/11/16  PSA  Total PSA 15.20 ng/mL 17.7 ng/ml 13.43 ng/dl 10.00 ng/mL 10.70 ng/mL 9.97 ng/dl 6.72 ng/mL 7.17 ng/dl    01/13/04  Hormones  Testosterone, Total 1.80     PROCEDURES: None   ASSESSMENT:      ICD-10 Details  1 GU:   Prostate Cancer - C61    PLAN:           Document Letter(s):  Created for Patient: Clinical Summary         Notes:   1. Favorable intermediate risk prostate cancer: We reviewed his imaging studies in conference prior to the clinic today. Specifically, we reviewed his PSMA PET scan with Dr. Leonia Reeves who has the most experience reading PSMA PET scans in our health system. It was his opinion that the mild uptake in the pelvic lymph nodes are unlikely to represent metastatic disease. As such, we had a discussion today regarding options for management. Suspicion was felt to be low enough that lymph node biopsy was not recommended. Ultimately, we reviewed the option of proceeding with definitive therapy. Certainly, if he proceed with surgical treatment, we would get pathological information from his lymph node dissection as well as his  postoperative PSA. Currently, he is interested in proceeding with treatment but would like to do this in a delayed fashion for financial reasons as he is self pay. We therefore discussed a few options.   The patient was counseled about the natural history of prostate cancer and the standard treatment options that are available for prostate cancer. It was explained to him how his age  and life expectancy, clinical stage, Gleason score, and PSA affect his prognosis, the decision to proceed with additional staging studies, as well as how that information influences recommended treatment strategies. We discussed the roles for active surveillance, radiation therapy, surgical therapy, androgen deprivation, as well as ablative therapy options for the treatment of prostate cancer as appropriate to his individual cancer situation. We discussed the risks and benefits of these options with regard to their impact on cancer control and also in terms of potential adverse events, complications, and impact on quality of life particularly related to urinary and sexual function. The patient was encouraged to ask questions throughout the discussion today and all questions were answered to his stated satisfaction. In addition, the patient was provided with and/or directed to appropriate resources and literature for further education about prostate cancer and treatment options.   Currently, he does wish to delay definitive therapy for 6-8 months but does feel fairly certain that he does wish to proceed with curative treatment at that time. We did discuss surgical therapy in detail any understands that my recommendation would be to proceed with a PSA in the next 3-4 months with a no other PSA and a repeat CT scan of the pelvis and abdomen to ensure no progression of lymphadenopathy that would increase the suspicion of metastatic disease. He would appear to be an appropriate candidate for a bilateral nerve-sparing robot assisted  laparoscopic radical prostatectomy and bilateral pelvic lymphadenectomy.   He also may be interested in proceeding with radiation therapy although cost may be an over riding factor in his decision. He is not a candidate for brachytherapy based on his prostate size. As such, his options may be external beam radiation therapy although stereotactic beam radiation therapy may be another option particularly if it is less costly. He would like to get cost information about his treatment options and will consider what approach he would like to take. I will notify Dr. Jeffie Newton of the discussion today. Mr. Brannigan plans to be back in to touch with Cira Rue, RN, in the near future with his decision on how he would like to proceed.   Cc: Dr. Crist Newton  Dr. Irine Newton  Dr. Zola Button  Dr. Tyler Pita        Next Appointment:      Next Appointment: 03/07/2021 10:00 AM    Appointment Type: Office Visit Established Patient    Location: [facility] [phone]    Provider: Irine Newton, M.D.    Reason for Visit: 3 mo recheck

## 2021-01-09 NOTE — Progress Notes (Signed)
Radiation Oncology         [phone] ________________________________  Multidisciplinary Prostate Cancer Clinic  Initial Radiation Oncology Consultation  Name: Jerome Newton MRN: [id number]  Date: 01/09/2021  DOB: [birth year]  CW:[id number], Jerome [place], MD  Jerome Seal, MD   REFERRING PHYSICIAN: Irine Seal, MD  DIAGNOSIS: 60 y.o. gentleman with stage T1c adenocarcinoma of the prostate with a Gleason's score of 3+3 and a PSA of 15.2    ICD-10-CM   1. Malignant neoplasm of prostate ([place])  C61     2. Prostate cancer ([place])  [name] is a 60 y.o. gentleman. He has been followed previously by Dr. Karsten Newton since at least 2009 for an elevated PSA. He was initially diagnosed with low volume Gleason 3+3 prostatic adenocarcinoma on 04/08/08, with a PSA at that time of 4.23. Since that time, he has been under active surveillance. He underwent surveillance biopsies on 01/02/12, 09/23/13, and 03/18/19, all of which again showed low volume Gleason 3+3 disease.   2015, PSA 4.9:   03/2019, PSA 13.43:   His PSA continued to be followed by his primary care physician, Dr. Joylene Newton. Further elevation to 17.7 was noted in 09/2020. Accordingly, he was referred for evaluation in urology by Dr. Jeffie Newton on 10/30/20,  digital rectal examination was performed at that time revealing no nodules. Repeat PSA that day showed a slight decrease to 15.2. The patient proceeded to prostate MRI on 11/23/20 showing two PI-RADS category 3 lesions, one in right mid gland transitional zone and one in right mid gland posterolateral peripheral zone as well as bilateral pelvic nodal enlargement along the pelvic sidewall with heterogeneous features, nonspecific but suspicious for malignancy.  The prostate volume was estimated to be 90 cc.    This prompted a PSMA PET scan which was performed on 01/02/21 showing isolated tracer-avid mild pelvic adenopathy and left-sided prostate tracer uptake, without  correlate on prior MRI, favored to be physiologic without evidence of tracer-avid extrapelvic metastasis.  Upon further review of his imaging studies with the radiologist in multidisciplinary prostate conference, the lymphadenopathy was not felt to be consistent with metastatic prostate cancer and instead was felt to be nonspecific.  The patient reviewed the biopsy results with his urologist and he has kindly been referred today to the multidisciplinary prostate cancer clinic for presentation of pathology and radiology studies in our conference for discussion of potential radiation treatment options and clinical evaluation.    PREVIOUS RADIATION THERAPY: No  PAST MEDICAL HISTORY:  has a past medical history of Allergy, Cancer ([place]) (~2008), GERD (gastroesophageal reflux disease), Hyperlipidemia, and Hypertension.    PAST SURGICAL HISTORY: Past Surgical History:  Procedure Laterality Date   abcess     left buttocks   COLONOSCOPY     GANGLION CYST EXCISION     POLYPECTOMY     TONSILLECTOMY     WISDOM TOOTH EXTRACTION      FAMILY HISTORY: family history is not on file. He was adopted.  SOCIAL HISTORY:  reports that he has been smoking pipe and cigars. He has been smoking an average of 0.25 packs per day. He has never used smokeless tobacco. He reports that he does not drink alcohol and does not use drugs.  ALLERGIES: Lisinopril and Pravastatin  MEDICATIONS:  Current Outpatient Medications  Medication Sig Dispense Refill   Candesartan Cilexetil (ATACAND PO) Take 8 mg by mouth 2 (two) times daily. Twice dailty     Cyanocobalamin (  VITAMIN B 12 PO) Take by mouth.     loratadine (CLARITIN) 10 MG tablet Take 10 mg by mouth daily.     LYSINE PO Take 100 mg by mouth daily.     Multiple Vitamins-Minerals (MULTIVITAMIN WITH MINERALS) tablet Take 1 tablet by mouth daily.     Omega-3 Fatty Acids (FISH OIL) 1000 MG CAPS Take 1,200 mg by mouth 2 (two) times daily.     pravastatin (PRAVACHOL) 20 MG  tablet Take 40 mg by mouth daily.     Triamcinolone Acetonide (NASACORT ALLERGY 24HR NA) Place into the nose.     acetaminophen (TYLENOL) 325 MG tablet Take 650 mg by mouth every 6 (six) hours as needed.   (Patient not taking: Reported on 01/09/2021)     Coenzyme Q10 100 MG TABS Take 100 mg by mouth daily. (Patient not taking: Reported on 01/02/3085)     Garlic 5784 MG TBEC Take 2,000 mg by mouth daily. (Patient not taking: Reported on 01/09/2021)     Ginkgo Biloba 40 MG TABS Take by mouth. (Patient not taking: Reported on 01/09/2021)     ibuprofen (ADVIL,MOTRIN) 400 MG tablet Take 400 mg by mouth every 6 (six) hours as needed.   (Patient not taking: Reported on 01/09/2021)     omeprazole (PRILOSEC) 40 MG capsule Take 1 capsule (40 mg total) by mouth daily. 30 capsule 2   Red Yeast Rice Extract (RED YEAST RICE PO) Take by mouth. (Patient not taking: Reported on 01/09/2021)     No current facility-administered medications for this encounter.    REVIEW OF SYSTEMS:  On review of systems, the patient reports that he is doing well overall. He denies any chest pain, shortness of breath, cough, fevers, chills, night sweats, unintended weight changes. He denies any bowel disturbances, and denies abdominal pain, nausea or vomiting. He denies any new musculoskeletal or joint aches or pains. His IPSS was 15, indicating moderate urinary symptoms. His SHIM was 19, indicating he has mild erectile dysfunction. A complete review of systems is obtained and is otherwise negative.   PHYSICAL EXAM:  Wt Readings from Last 3 Encounters:  01/09/21 241 lb (109.3 kg)  03/17/19 228 lb 6.4 oz (103.6 kg)  12/22/13 227 lb 12.8 oz (103.3 kg)   Temp Readings from Last 3 Encounters:  01/09/21 97.6 F (36.4 C)  03/17/19 (!) 96.6 F (35.9 C) (Temporal)  12/22/13 97.8 F (36.6 C) (Oral)   BP Readings from Last 3 Encounters:  01/09/21 (!) 127/93  12/22/13 126/71  11/18/13 138/86   Pulse Readings from Last 3 Encounters:   01/09/21 (!) 102  12/22/13 71  11/18/13 83    /10  In general this is a well appearing Caucasian male in no acute distress. He's alert and oriented x4 and appropriate throughout the examination. Cardiopulmonary assessment is negative for acute distress and he exhibits normal effort.    KPS = 100  100 - Normal; no complaints; no evidence of disease. 90   - Able to carry on normal activity; minor signs or symptoms of disease. 80   - Normal activity with effort; some signs or symptoms of disease. 95   - Cares for self; unable to carry on normal activity or to do active work. 60   - Requires occasional assistance, but is able to care for most of his personal needs. 50   - Requires considerable assistance and frequent medical care. 29   - Disabled; requires special care and assistance. 30   -  Severely disabled; hospital admission is indicated although death not imminent. 59   - Very sick; hospital admission necessary; active supportive treatment necessary. 10   - Moribund; fatal processes progressing rapidly. 0     - Dead  Karnofsky DA, Abelmann [place], Craver LS and Burchenal JH [phone]) The use of the nitrogen mustards in the palliative treatment of carcinoma: with particular reference to bronchogenic carcinoma Cancer 1 [id number]   LABORATORY DATA:  Lab Results  Component Value Date   WBC 15.1 (A) 12/06/2011   HGB 15.3 12/06/2011   HCT 46.0 12/06/2011   MCV 100.5 (A) 12/06/2011   Lab Results  Component Value Date   NA 141 08/26/2011   K 4.6 08/26/2011   CL 102 08/26/2011   CO2 26 08/26/2011   Lab Results  Component Value Date   ALT 25 08/26/2011   AST 20 08/26/2011   ALKPHOS 69 08/26/2011   BILITOT 0.4 08/26/2011     RADIOGRAPHY: NM PET (PSMA) SKULL TO MID THIGH  Result Date: 01/03/2021 CLINICAL DATA:  Prostate cancer with rising PSA and pelvic adenopathy. PSA of 15.2. EXAM: NUCLEAR MEDICINE PET SKULL BASE TO THIGH TECHNIQUE: 9.1 mCi F18 Piflufolastat (Pylarify) was injected  intravenously. Full-ring PET imaging was performed from the skull base to thigh after the radiotracer. CT data was obtained and used for attenuation correction and anatomic localization. COMPARISON:  Prostate MR of 11/23/2020. FINDINGS: NECK No radiotracer activity in neck lymph nodes. Incidental CT finding: Mucosal thickening right maxillary sinus. No cervical adenopathy. Multiple small bilateral posterior triangle nodes. CHEST A left axillary node measures 1.1 cm and a S.U.V. max of 1.8 on 79/4, favored to be reactive. No mediastinal or hilar nodal hypermetabolism. No pulmonary parenchymal hypermetabolism. Incidental CT finding: Borderline ascending aortic dilatation, including at 4.0 cm on 94/4. Tortuous thoracic aorta. Mild cardiomegaly. ABDOMEN/PELVIS Prostate: Left-sided prostatic tracer uptake involving the mid gland peripheral zone measures a S.U.V. 5.9 and is without correlate on prior MRI. Lymph nodes: No abdominal nodal tracer uptake. Right external iliac node measures 1.3 cm and a S.U.V. max of 3.3 on 224/4. Left external iliac node measures 1.1 cm and a S.U.V. max of 2.9 on 225/4. Left external iliac node more cephalad measures 7 mm and a S.U.V. max of 2.6 on 13/4. More cephalad right external iliac node measures 8 mm and a S.U.V. max of 2.2 on 216/4. Liver: No evidence of liver metastasis Incidental CT finding: Hepatic steatosis. Segment 4A 1 cm hepatic cyst. Multiple dependent gallstones. Normal adrenal glands. Mild renal cortical thinning bilaterally. Abdominal aortic atherosclerosis. Mild prostatomegaly. Fat containing left inguinal hernia. SKELETON No focal  activity to suggest skeletal metastasis. 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 Moderate bilateral gynecomastia. IMPRESSION: 1. Isolated tracer avid mild pelvic adenopathy, highly suspicious for nodal metastasis. 2. Left-sided prostate tracer uptake, without correlate on prior MRI. Favored to be physiologic. 3. No evidence of  tracer avid extrapelvic metastasis. 4. Borderline ascending aortic dilatation, 4.0 cm. Recommend annual imaging followup by CTA or MRA. This recommendation follows 2010 ACCF/AHA/AATS/ACR/ASA/SCA/SCAI/SIR/STS/SVM Guidelines for the Diagnosis and Management of Patients with Thoracic Aortic Disease. Circulation. 2010; 121: D322-G254. Aortic aneurysm NOS (ICD10-I71.9) 5. Incidental findings, including: Hepatic steatosis. Cholelithiasis. Bilateral gynecomastia. Aortic Atherosclerosis (ICD10-I70.0). Electronically Signed   By: Abigail Miyamoto M.D.   On: 01/03/2021 14:09      IMPRESSION/PLAN: 60 y.o. gentleman with stage T1c adenocarcinoma of the prostate with a Gleason's score of 3+3 and a PSA of 15.2  We discussed the patient's  workup and outlined the nature of prostate cancer in this setting. The patient's T stage, Gleason's score, and PSA put him into the intermediate risk group.  We discussed the need for updated pathology given that his biopsy results are from 2020, if he is in favor of continuing in active surveillance.  However, we would support proceeding with prostatectomy for both therapeutic and diagnostic purposes in that information could be obtained from the enlarged lymph node(s) at that time as well as gaining further information regarding the disease in his prostate.  Should he elect to continue in active surveillance, and an updated biopsy confirm localized, intermediate risk prostate cancer, he is eligible for a variety of potential treatment options including 5.5 weeks of external radiation, brachytherapy or prostatectomy. We discussed the available radiation techniques, and focused on the details and logistics of delivery. The patient is not an ideal candidate for brachytherapy with a prostate volume of 75 gm on last biopsy in 2020 (90 gm on MRI). We discussed and outlined the risks, benefits, short and long-term effects associated with radiotherapy and compared and contrasted these with  prostatectomy. We discussed the role of SpaceOAR gel in reducing the rectal toxicity associated with radiotherapy. He appears to have a good understanding of his disease and our treatment recommendations which are of curative intent.  He was encouraged to ask questions that were answered to his stated satisfaction.  At the end of the conversation the patient is interested in moving forward with prostatectomy but prefers to wait until early 2023 for financial reasons.  We will share our discussion with Dr. Jeffie Newton and Dr. Alinda Money so that they can move forward with treatment planning accordingly.  We enjoyed meeting him and his wife today and look forward to continuing to follow his progress.  Of course, we would be more than happy to continue to participate in his care if clinically indicated in the future.   We personally spent 75 minutes in this encounter including chart review, reviewing radiological studies, meeting face-to-face with the patient and completing documentation.     Nicholos Johns, PA-C    Tyler Pita, MD  [facility] Direct Dial: [phone]  Fax: [phone] [email or web address]  Skype  LinkedIn   This document serves as a record of services personally performed by Tyler Pita, MD and Freeman Caldron, PA-C. It was created on their behalf by Wilburn Mylar, a trained medical scribe. The creation of this record is based on the scribe's personal observations and the provider's statements to them. This document has been checked and approved by the attending provider.

## 2021-01-09 NOTE — Progress Notes (Signed)
Reason for the request:    Prostate cancer  HPI: I was asked by Dr. [name] to evaluate [name] for the evaluation of prostate cancer.  He is a 60 year old with history of prostate cancer dating back to 2009.  At that time he had a PSA 4.23 and biopsy showed a Gleason score 6 T1c disease.  He had been on active surveillance intermittently since that time.  A repeat biopsy in 2015 showed similar Gleason score.  His PSA in April 2022 showed a PSA 15.2 compared to 17 back in March 2022.  In July 2020 was 13.4.  He underwent MRI of the prostate which showed a PI-RADS category 3 lesion in the right lateral mid gland and a PI-RADS 3 in the right mid gland.  Bilateral pelvic nodal enlargement with heterogeneous features that are nonspecific.  Based on these findings he underwent PSMA PET scan which showed isolated uptake in the left side of the prostate with equivocal uptake in the pelvic lymph nodes.  Clinically he is asymptomatic at this time.  He denies any urinary frequency urgency or hesitancy.  He denies any constitutional symptoms or bone pain.  He does not report any headaches, blurry vision, syncope or seizures. Does not report any fevers, chills or sweats.  Does not report any cough, wheezing or hemoptysis.  Does not report any chest pain, palpitation, orthopnea or leg edema.  Does not report any nausea, vomiting or abdominal pain.  Does not report any constipation or diarrhea.  Does not report any skeletal complaints.    Does not report frequency, urgency or hematuria.  Does not report any skin rashes or lesions. Does not report any heat or cold intolerance.  Does not report any lymphadenopathy or petechiae.  Does not report any anxiety or depression.  Remaining review of systems is negative.     Past Medical History:  Diagnosis Date   Allergy    hay fever   Cancer ([place]) ~2008   prostate   GERD (gastroesophageal reflux disease)    Hyperlipidemia    Hypertension   :   Past Surgical  History:  Procedure Laterality Date   abcess     left buttocks   COLONOSCOPY     GANGLION CYST EXCISION     POLYPECTOMY     TONSILLECTOMY     WISDOM TOOTH EXTRACTION    :   Current Outpatient Medications:    acetaminophen (TYLENOL) 325 MG tablet, Take 650 mg by mouth every 6 (six) hours as needed.  , Disp: , Rfl:    Candesartan Cilexetil (ATACAND PO), Take 8 mg by mouth daily. Twice dailty, Disp: , Rfl:    Coenzyme Q10 100 MG TABS, Take 100 mg by mouth daily., Disp: , Rfl:    Cyanocobalamin (VITAMIN B 12 PO), Take by mouth., Disp: , Rfl:    Garlic 2595 MG TBEC, Take 2,000 mg by mouth daily., Disp: , Rfl:    Ginkgo Biloba 40 MG TABS, Take by mouth., Disp: , Rfl:    ibuprofen (ADVIL,MOTRIN) 400 MG tablet, Take 400 mg by mouth every 6 (six) hours as needed.  , Disp: , Rfl:    loratadine (CLARITIN) 10 MG tablet, Take 10 mg by mouth daily., Disp: , Rfl:    LYSINE PO, Take 100 mg by mouth daily., Disp: , Rfl:    Multiple Vitamins-Minerals (MULTIVITAMIN WITH MINERALS) tablet, Take 1 tablet by mouth daily., Disp: , Rfl:    Omega-3 Fatty Acids (FISH OIL) 1000 MG CAPS, Take  1,200 mg by mouth 2 (two) times daily., Disp: , Rfl:    omeprazole (PRILOSEC) 40 MG capsule, Take 1 capsule (40 mg total) by mouth daily., Disp: 30 capsule, Rfl: 2   pravastatin (PRAVACHOL) 20 MG tablet, Take 40 mg by mouth daily., Disp: , Rfl:    Red Yeast Rice Extract (RED YEAST RICE PO), Take by mouth., Disp: , Rfl:    Triamcinolone Acetonide (NASACORT ALLERGY 24HR NA), Place into the nose., Disp: , Rfl: :   Allergies  Allergen Reactions   Lisinopril     Respiratory issues   Pravastatin     Respiratory issues  :   Family History  Adopted: Yes  :   Social History   Socioeconomic History   Marital status: Married    Spouse name: Not on file   Number of children: Not on file   Years of education: Not on file   Highest education level: Not on file  Occupational History   Not on file  Tobacco Use    Smoking status: Some Days    Packs/day: 0.25    Pack years: 0.00    Types: Pipe, Cigars, Cigarettes   Smokeless tobacco: Never  Vaping Use   Vaping Use: Never used  Substance and Sexual Activity   Alcohol use: No    Comment: rarely   Drug use: No   Sexual activity: Not on file  Other Topics Concern   Not on file  Social History Narrative   Not on file   Social Determinants of Health   Financial Resource Strain: Not on file  Food Insecurity: Not on file  Transportation Needs: Not on file  Physical Activity: Not on file  Stress: Not on file  Social Connections: Not on file  Intimate Partner Violence: Not on file  :  Pertinent items are noted in HPI.  Exam: ECOG 0 General appearance: alert and cooperative appeared without distress. Head: atraumatic without any abnormalities. Eyes: conjunctivae/corneas clear. PERRL.  Sclera anicteric. Throat: lips, mucosa, and tongue normal; without oral thrush or ulcers. Resp: clear to auscultation bilaterally without rhonchi, wheezes or dullness to percussion. Cardio: regular rate and rhythm, S1, S2 normal, no murmur, click, rub or gallop GI: soft, non-tender; bowel sounds normal; no masses,  no organomegaly Skin: Skin color, texture, turgor normal. No rashes or lesions Lymph nodes: Cervical, supraclavicular, and axillary nodes normal. Neurologic: Grossly normal without any motor, sensory or deep tendon reflexes. Musculoskeletal: No joint deformity or effusion.    NM PET (PSMA) SKULL TO MID THIGH  Result Date: 01/03/2021 CLINICAL DATA:  Prostate cancer with rising PSA and pelvic adenopathy. PSA of 15.2. EXAM: NUCLEAR MEDICINE PET SKULL BASE TO THIGH TECHNIQUE: 9.1 mCi F18 Piflufolastat (Pylarify) was injected intravenously. Full-ring PET imaging was performed from the skull base to thigh after the radiotracer. CT data was obtained and used for attenuation correction and anatomic localization. COMPARISON:  Prostate MR of 11/23/2020.  FINDINGS: NECK No radiotracer activity in neck lymph nodes. Incidental CT finding: Mucosal thickening right maxillary sinus. No cervical adenopathy. Multiple small bilateral posterior triangle nodes. CHEST A left axillary node measures 1.1 cm and a S.U.V. max of 1.8 on 79/4, favored to be reactive. No mediastinal or hilar nodal hypermetabolism. No pulmonary parenchymal hypermetabolism. Incidental CT finding: Borderline ascending aortic dilatation, including at 4.0 cm on 94/4. Tortuous thoracic aorta. Mild cardiomegaly. ABDOMEN/PELVIS Prostate: Left-sided prostatic tracer uptake involving the mid gland peripheral zone measures a S.U.V. 5.9 and is without correlate on prior MRI. Lymph  nodes: No abdominal nodal tracer uptake. Right external iliac node measures 1.3 cm and a S.U.V. max of 3.3 on 224/4. Left external iliac node measures 1.1 cm and a S.U.V. max of 2.9 on 225/4. Left external iliac node more cephalad measures 7 mm and a S.U.V. max of 2.6 on 13/4. More cephalad right external iliac node measures 8 mm and a S.U.V. max of 2.2 on 216/4. Liver: No evidence of liver metastasis Incidental CT finding: Hepatic steatosis. Segment 4A 1 cm hepatic cyst. Multiple dependent gallstones. Normal adrenal glands. Mild renal cortical thinning bilaterally. Abdominal aortic atherosclerosis. Mild prostatomegaly. Fat containing left inguinal hernia. SKELETON No focal  activity to suggest skeletal metastasis. 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 Moderate bilateral gynecomastia. IMPRESSION: 1. Isolated tracer avid mild pelvic adenopathy, highly suspicious for nodal metastasis. 2. Left-sided prostate tracer uptake, without correlate on prior MRI. Favored to be physiologic. 3. No evidence of tracer avid extrapelvic metastasis. 4. Borderline ascending aortic dilatation, 4.0 cm. Recommend annual imaging followup by CTA or MRA. This recommendation follows 2010 ACCF/AHA/AATS/ACR/ASA/SCA/SCAI/SIR/STS/SVM Guidelines for  the Diagnosis and Management of Patients with Thoracic Aortic Disease. Circulation. 2010; 121: G644-I347. Aortic aneurysm NOS (ICD10-I71.9) 5. Incidental findings, including: Hepatic steatosis. Cholelithiasis. Bilateral gynecomastia. Aortic Atherosclerosis (ICD10-I70.0). Electronically Signed   By: Abigail Miyamoto M.D.   On: 01/03/2021 14:09    Assessment and Plan:    60 year old man with:  1.  Prostate cancer diagnosed in 2009 with a Gleason score of 3+3 equal 6 and a PSA of 4.23.  He remained on active surveillance and recent PSA was 15.2 and MRI in May 2022 showed PI-RADS 3 lesions.  Is case was discussed today in the prostate cancer multidisciplinary clinic.  His PSMA PET scan was discussed with radiology today and felt that his lymph nodes are less likely to be prostate cancer given the appearance.  His pathology results from previously were also reviewed by the reviewing pathologist.  The natural course of this disease and treatment choices were discussed today.  Continued active surveillance versus active treatment were discussed.  Active treatment, surgery versus radiation were also reviewed.  Given the rise in his PSA it is reasonable to consider treatment in him if he prefers choice but he appears to have less inclined to do so.  Active surveillance will continue to be reasonable at this time.  He will consider his options today and make a decision in near future.  He understands that active surveillance with strict monitoring is paramount at this time.  2.  Pelvic lymph nodes: Indeterminate at this time with unclear etiology.  Reactive versus a low-grade lymphoproliferative disorder could be related.  Follow-up imaging studies in the future recommended.   45  minutes were dedicated to this visit. The time was spent on reviewing laboratory data, imaging studies, discussing treatment options, discussing differential diagnosis and answering questions regarding future plan.   A copy of this  consult has been forwarded to the requesting physician.

## 2021-01-09 NOTE — Progress Notes (Unsigned)
                               Care Plan Summary  Name: [name] DOB: 27-Mar-1961   Your Medical Team:   Urologist -  Dr. Raynelle Bring, [facility]  Radiation Oncologist - Dr. Tyler Pita, [facility]   Medical Oncologist - Dr. Zola Button, [place]  Recommendations: 1) robotic prostatectomy 2) radiation  * These recommendations are based on information available as of today's consult.      Recommendations may change depending on the results of further tests or exams.  Next Steps: 1) Consider your options and call Cira Rue, RN.    When appointments need to be scheduled, you will be contacted by [facility] and/or [facility].  Questions?  Please do not hesitate to call Cira Rue, RN, BSN, OCN at [phone] any questions or concerns.  Shirlean Mylar is your Oncology Nurse Navigator and is available to assist you while you're receiving your medical care at [facility].

## 2021-01-09 NOTE — Progress Notes (Signed)
GU Location of Tumor / Histology Prostate  If Prostate Cancer, Gleason Score is ( + ) and PSA is ()  Jerome Newton presented  months ago with signs/symptoms of:   Biopsies of  (if applicable) [id number]    03/2019 Past/Anticipated interventions by urology, if any:   Past/Anticipated interventions by medical oncology, if any:   Weight changes, if any:   Bowel/Bladder complaints, if any:    Nausea/Vomiting, if any:   Pain issues, if any:    SAFETY ISSUES: Prior radiation?  Pacemaker/ICD?  Possible current pregnancy?  Is the patient on methotrexate?   Current Complaints / other details:

## 2021-01-10 ENCOUNTER — Encounter: Payer: Self-pay | Admitting: General Practice

## 2021-01-10 NOTE — Progress Notes (Signed)
[facility] Spiritual Care Note  Followed up with Jerome Newton by phone after he attended [facility]. Introduced [facility] support resources. 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 He reports good support from his wife and his cousin, who lives nearby. He notes no other needs or concerns at this time, but is aware of ongoing support resource availability.   [place], [place], [facility] Pager [phone] Voicemail [phone]

## 2021-03-06 ENCOUNTER — Other Ambulatory Visit: Payer: Self-pay | Admitting: Internal Medicine

## 2021-03-06 ENCOUNTER — Encounter: Payer: Self-pay | Admitting: Medical Oncology

## 2021-03-06 DIAGNOSIS — E785 Hyperlipidemia, unspecified: Secondary | ICD-10-CM

## 2021-03-06 NOTE — Progress Notes (Signed)
Per Dr. Tammi Klippel, pt has a rising PSA and he would like to discuss not delaying treatment. I called patient to see if he would like to schedule a follow up consult to discuss radiation. I spoke with patient at length. He is worried about the cost and is currently applying to  the [place] to help with cost. He has forms from [facility] to assist with cost and to apply for the prostate grant but has never completed. I stressed the importance of being re-consulted and not delaying treatment. He has an appointment tomorrow with Dr. [name] and will discuss further. I asked him to call me if he would to set up an appointment with Dr. Tammi Klippel.  Dr. Tammi Klippel and Dr. [name] notified of the above.

## 2021-03-29 ENCOUNTER — Ambulatory Visit
Admission: RE | Admit: 2021-03-29 | Discharge: 2021-03-29 | Disposition: A | Discharge status: Home / Self Care | Payer: No Typology Code available for payment source | Source: Ambulatory Visit | Attending: Internal Medicine | Admitting: Internal Medicine

## 2021-03-29 DIAGNOSIS — I7121 Aneurysm of the ascending aorta, without rupture: Secondary | ICD-10-CM

## 2021-03-29 DIAGNOSIS — E785 Hyperlipidemia, unspecified: Secondary | ICD-10-CM

## 2021-03-29 HISTORY — DX: Aneurysm of the ascending aorta, without rupture: I71.21

## 2021-05-02 ENCOUNTER — Encounter: Payer: Self-pay | Admitting: Urology

## 2021-05-23 NOTE — Progress Notes (Signed)
Patient is not interested in starting any treatment until next year. He has a scheduled f/u with Dr. [name] 07/25/21 and will make a decision at that time regarding proceeding with the recommended LT-ADT and prostate IMRT. We will await an update from Dr. [name] or the patient and then proceed with treatment planning accordingly at that time.  Nicholos Johns, MMS, PA-C [place] [facility] [place]: [phone]  Fax: [phone]

## 2021-08-02 ENCOUNTER — Telehealth: Payer: Self-pay | Admitting: Radiation Oncology

## 2021-08-02 NOTE — Telephone Encounter (Signed)
°  Radiation Oncology         [phone] ________________________________  Name: Jerome Newton MRN: [id number]  Date: 08/02/2021  DOB: 18-Feb-1961  Telephone contact:  I received a message to call this patient and returned the phone call.  He answered, and we discussed intiating ADT and IMRT. He also requested information on treatment costs, since he has no insurance and will be cost sharing the treatment.  I will ask our financial navigator team to reach out to him with cost estimates I will ask Dr. [name] to start 6 months ADT I will ask Enid Derry to set up gold markers, SpaceOAR and IMRT simulation  ________________________________  Sheral Apley Tammi Klippel, M.D.

## 2021-08-08 ENCOUNTER — Encounter: Payer: Self-pay | Admitting: Radiation Oncology

## 2021-08-08 NOTE — Progress Notes (Signed)
I spoke with patient today about cost estimate for radiation treatment and sent an email with information as well.

## 2021-09-05 ENCOUNTER — Telehealth: Payer: Self-pay | Admitting: *Deleted

## 2021-09-05 NOTE — Telephone Encounter (Signed)
CALLED [facility] TO SEE IF THEY CAN GET THIS PATIENT AN EARLIER APPT. FOR HIS ADT, CURRENTLY SCHEDULED FOR 09-24-21, I WAS PUT IN A NURSE'S VM AND LVM FOR HER TO GET THIS APPT. MOVED UP

## 2021-09-07 NOTE — Progress Notes (Signed)
Patient scheduled for ADT @ [facility] on 2/28 @ 11am.  Patient notified.

## 2021-10-04 ENCOUNTER — Other Ambulatory Visit: Payer: Self-pay | Admitting: Urology

## 2021-10-04 ENCOUNTER — Other Ambulatory Visit (HOSPITAL_COMMUNITY): Payer: Self-pay | Admitting: Urology

## 2021-10-17 ENCOUNTER — Other Ambulatory Visit: Payer: Self-pay

## 2021-10-17 ENCOUNTER — Encounter (HOSPITAL_BASED_OUTPATIENT_CLINIC_OR_DEPARTMENT_OTHER): Payer: Self-pay | Admitting: Urology

## 2021-10-17 NOTE — Progress Notes (Addendum)
Spoke w/ via phone for pre-op interview---pt ?Lab needs dos---- [facility], ekg             ?Lab results------ ct cardiac scoring  03-29-2021 epic, lov dr mark perini pcp 08-27-2021 on chart, old ekg 01-10-2009 dr [name] on chart, old labs 03-02-2021 dr perrini on pt chart ?COVID test -----patient states asymptomatic no test needed ?Arrive at -------630 am 10-26-2021 ?NPO after MN NO Solid Food.  Clear liquids from MN until---530 am ?Med rec completed ?Medications to take morning of surgery -----Pravastatin, Nasacort, Loratadine ?Diabetic medication -----n/a ?Patient instructed no nail polish to be worn day of surgery ?Patient instructed to bring photo id and insurance card day of surgery ?Patient aware to have Driver (ride ) / caregiver wife Lorie   for 24 hours after surgery  ?Patient Special Instructions -----Fleets enema hs night before surgery ?Pre-Op special Istructions -----none ?Patient verbalized understanding of instructions that were given at this phone interview. ?Patient denies shortness of breath, chest pain, fever, cough at this phone interview.  ? ?Addendum: Spoke with dr Richrd Prime mda and reviewed pt meets [facility] guidelines and pt finished  prednisone and doxycycline  for bronchitis on 10-14-2021 and pt was reporting he was fully recovered from bronchitis and having no symptoms when spoken to  by phone on 10-17-2021, pt ok for surgery on 10-26-2021  @ [facility] per dr e oddono mda. ?

## 2021-10-18 ENCOUNTER — Encounter (HOSPITAL_BASED_OUTPATIENT_CLINIC_OR_DEPARTMENT_OTHER): Payer: Self-pay | Admitting: Urology

## 2021-10-23 MED ORDER — ACETAMINOPHEN 500 MG PO TABS
ORAL_TABLET | ORAL | Status: AC
  Filled 2021-10-23: qty 2

## 2021-10-23 MED ORDER — CELECOXIB 200 MG PO CAPS
ORAL_CAPSULE | ORAL | Status: AC
  Filled 2021-10-23: qty 2

## 2021-10-23 MED ORDER — CEFAZOLIN SODIUM-DEXTROSE 2-4 GM/100ML-% IV SOLN
INTRAVENOUS | Status: AC
  Filled 2021-10-23: qty 100

## 2021-10-25 NOTE — H&P (Signed)
I have been diagnosed with prostate cancer.  ?HPI: Jerome Newton is a 61 year-old male established patient who is here for evaluation of prostate cancer. ? ?His prostate cancer was diagnosed 04/08/2008. His PSA at his time of diagnosis was 4.23. His cancer was T1c, Gleason 6.  ? ?His most recent PSA is 15.4. The patient states he is on active surveillance. He has not undergone surgery for treatment. He has not undergone External Beam Radiation Therapy for treatment. He has not undergone Hormonal Therapy for treatment.  ? ?He does not have urinary incontinence. He has not recently had unwanted weight loss. He is not having new bone pain.  ? ?This condition would be considered of mild to moderate severity with no modifying factors or associated signs or symptoms other than as noted above.  ? ?09/24/21: Jerome Newton returns today in f/u. He got [place] '240mg'$  2 wks ago in preparation for EXRT. He had an injection site reaction with some pain and itching. He has some hot flashes but his energy level is good.  ? ?08/01/21: Jerome Newton returns today in f/u. His PSA is down to 15.4 from 23.8. He is voiding well with an IPSS of 7. He remains on tadalafil and sildenafil for ED with a good response. He has been considering Rtx for the prostate cancer. I will have Dr. Tammi Klippel get back in touch with him.  ? ?04/30/21: Jerome Newton returns today for the history below. His PSA is up further to 23.8. His IPSS is 11. He has a reduced stream and is content with his symptoms. He has no bone pain or weight loss.  ? ?03/07/21: Jerome Newton returns today in f/u for his history of prostate cancer. His PSA is up to 18.9 with labs from Dr. [name]. He has no new symptoms and his IPSS is 11 with a reduced stream. He has some issues with ED that is progressive over the last 2 months. He has difficulty getting and maintaining his erections. He has not started any new meds. He has been seen in the [facility] and is leaning toward radiation but was waiting because of financial  issues. He has contacted the [place] about benefits.  ? ?Prostate cancer:  ?Diagnosis: Clinical stage, T1c prostate cancer  ?Date of diagnosis: 04/08/08  ?Type of treatment: Active Surveillance  ?Pretreatment PSA: 4.23  ?Gleason score: 3+3=6 2/12 cores positive.  ?Prostate Biopsies: Positive prostate biopsy ( 04/08/08), Positive prostate biopsy (01/12/12).  ?Urinary function: He denies incontinence.  ?Erectile function: He denies erectile dysfunction.  ?2nd TRUS/BX in 6/13 (PSA 4.57), prostate volume 57 cc  ?Pathology: The biopsy was positive for only 1/12 cores positive with 40% involvement of the core located at the left apex medially.  ?3rd TRUS/BX 09/23/13 (PSA 4.90), prostate volume 58 cc  ?Pathology: Adenocarcinoma 3+3 = 6, 2/12 cores positive (5% and 20%)  ?Treatment: Continued active surveillance  ?PSA - 13.43, DRE - benign  ?4th TRUS/Bx 03/18/19: Prostate volume - 75 cc  ?Pathology: Gleason 6, 4/12 cores positive (10%, 5%, 5% and 2%)  ?Stage: T1c (favorable, intermediate risk)  ?PSA 17.7 10/04/20  ?PSA 15.2 11/06/20  ?MRIP 2 right PIRADS 3 lesions and bilateral pelvic adenopathy worrisome for prostate cancer metastases vs lymph proliferative disorder vs reactive.  ? ?12/04/20: Jerome Newton returns with his wife to discuss the recent MRIP results.  ? ?10/30/20: He returns today at the request of Dr. [name] for a further rise in his PSA to 17.7 from 13.4 at his last f/u. He has no new voiding  complaints. He has no weight loss. He has no bone pain. He had a respiratory illness for about a week prior to the blood draw.  ? ?  ?AUA Symptom Score: Less than 50% of the time he has the sensation of not emptying his bladder completely when finished urinating. He never has to urinate again less that two hours after he has finished urinating. Less than 20% of the time he has to start and stop again several times when he urinates. He never finds it difficult to postpone urination. Less than 50% of the time he has a weak urinary stream. He  never has to push or strain to begin urination. He has to get up to urinate 2 times from the time he goes to bed until the time he gets up in the morning.  ? ?Calculated AUA Symptom Score: 7 ? ?  ?ALLERGIES: No Allergies ?  ? ?MEDICATIONS: Albuterol Sulfate Hfa 90 mcg hfa aerosol with adapter  ?Benzonatate 200 mg capsule Oral PRN  ?Claritin 10 MG Oral Tablet Oral  ?Daily Multiple Vitamin tablet Oral  ?Fish Oil 1000 MG Oral Capsule Oral  ?Garlic  ?Ginkgo  ?Lisinopril 20 MG Oral Tablet Oral  ?L-Lysine  ?Nasacort Aq 55 mcg aerosol, spray Nasal  ?Pravastatin Sodium 20 MG Oral Tablet Oral  ?Red Yeast Rice CAPS Oral  ?Sildenafil Citrate 20 mg tablet 1-5 po daily prn  ?Tadalafil 5 mg tablet 1-4 po daily prn.  ?  ? ?GU PSH: Prostate Needle Biopsy - 2020, 2009 ? ?  ?   ?PSH Notes: Biopsy Of The Prostate Needle, Tonsillectomy  ? ?NON-GU PSH: Remove Tonsils - 2009 ?Surgical Pathology, Gross And Microscopic Examination For Prostate Needle - 2020 ? ?  ? ?GU PMH: Prostate Cancer - 09/11/2021, His PSA is down some and he has no worrisome symptoms. We discussed getting started with RTx and he is agreeable to me reaching out to Dr. Tammi Klippel for further consideration of treatment. , - 08/01/2021, His PSA continues to rise. He is trying to delay therapy until next year and is most interested in RTx. I have let Dr. Tammi Klippel know. I also discussed the need for adjuvant ADT but will hold off on initiation until his desire to initiate treatment becomes more clear. I did review the side effects and will have him return in 3 months with a PSA. , - 04/30/2021, His PSA continues to rise and the PET suggested pelvic nodal uptake but on further review that was felt not to be the case. He is going to reach out to the [place] and see about getting benefits since he was in [employer] for several years. He has f/u in October that he will keep. He is leaning toward radiation but is concerned about the financial impact since he is uninsured. He has been  fully informed about the risks of delaying care. , - 03/07/2021, - 01/09/2021, He has a history of Gleason 6 prostate cancer on surveillance and has 2 moderate risk lesions on MRI but more concerning is the presence of adenopathy on the MRI. After reviewing the results, I will get a PSMA PET try to determine whether the nodes are associated with the prostate cancer. He has f/u scheduled in August but further evaluation and treatment will be based on the PET. , - 12/04/2020, Jerome Newton has a history of T1c Nx Mx Gleason 6 prostate cancer with a rising PSA that is up to 17.7 from 13.4 in 2020. He had a respiratory illness  for a week prior to his last lab draw so I will get a repeat PSA with a week of abstinence to make sure the illness didn't impact the PSA. I will also get him scheduled for a prostate MRI. I discussed the implications of the rising PSA and the possible progression of his prostate cancer but he seemed most interested in continuing surveillance despite my concerns. I will have him return in 6 months with a PSA unless the repeat PSA and MRI suggest a need to do further biopsies or treatment. , - 10/30/2020 (Stable), Return in 6 months for repeat DRE and PSA., - 2020 (Stable), He did not pick up his preop antibiotics so he received a g of Rocephin and I sent in a single dose for him to take the soon as he leaves today., - 2020 (Worsening), We discussed the fact that his PSA is up further and therefore my recommendation, since it has been 3 years since his last biopsy, is to proceed with a prostate biopsy. He understands but due to financial reasons he indicated he did not want to have this performed until the 1st of the year. I did make him aware that there was risk in waiting. He said he was aware of this and assumed the risk. I will check a PSA again in 6 months and we will plan to proceed with repeat prostate biopsy after the 1st of the year., - 2019 (Stable), His prostate was noted to be smooth and benign to  exam. He has a stable creatinine at 6.72. Continue active surveillance., - 2018 (Stable), His prostate remains benign to exam. His most recent PSA last month at 7.17 is up slightly from where it was previously

## 2021-10-26 ENCOUNTER — Ambulatory Visit (HOSPITAL_BASED_OUTPATIENT_CLINIC_OR_DEPARTMENT_OTHER): Payer: No Typology Code available for payment source | Admitting: Anesthesiology

## 2021-10-26 ENCOUNTER — Other Ambulatory Visit: Payer: Self-pay

## 2021-10-26 ENCOUNTER — Ambulatory Visit (HOSPITAL_BASED_OUTPATIENT_CLINIC_OR_DEPARTMENT_OTHER)
Admission: RE | Admit: 2021-10-26 | Discharge: 2021-10-26 | Disposition: A | Discharge status: Home / Self Care | Payer: No Typology Code available for payment source | Attending: Urology | Admitting: Urology

## 2021-10-26 ENCOUNTER — Encounter (HOSPITAL_BASED_OUTPATIENT_CLINIC_OR_DEPARTMENT_OTHER)
Admission: RE | Disposition: A | Discharge status: Home / Self Care | Payer: Self-pay | Source: Home / Self Care | Attending: Urology

## 2021-10-26 ENCOUNTER — Encounter (HOSPITAL_BASED_OUTPATIENT_CLINIC_OR_DEPARTMENT_OTHER): Payer: Self-pay | Admitting: Urology

## 2021-10-26 DIAGNOSIS — Z79899 Other long term (current) drug therapy: Secondary | ICD-10-CM | POA: Insufficient documentation

## 2021-10-26 DIAGNOSIS — C61 Malignant neoplasm of prostate: Secondary | ICD-10-CM | POA: Insufficient documentation

## 2021-10-26 DIAGNOSIS — F419 Anxiety disorder, unspecified: Secondary | ICD-10-CM | POA: Insufficient documentation

## 2021-10-26 DIAGNOSIS — Z6833 Body mass index (BMI) 33.0-33.9, adult: Secondary | ICD-10-CM | POA: Insufficient documentation

## 2021-10-26 DIAGNOSIS — Z87891 Personal history of nicotine dependence: Secondary | ICD-10-CM | POA: Insufficient documentation

## 2021-10-26 DIAGNOSIS — I10 Essential (primary) hypertension: Secondary | ICD-10-CM | POA: Insufficient documentation

## 2021-10-26 DIAGNOSIS — E669 Obesity, unspecified: Secondary | ICD-10-CM | POA: Insufficient documentation

## 2021-10-26 HISTORY — DX: Malignant neoplasm of prostate: C61

## 2021-10-26 HISTORY — PX: SPACE OAR INSTILLATION: SHX6769

## 2021-10-26 HISTORY — PX: GOLD SEED IMPLANT: SHX6343

## 2021-10-26 HISTORY — DX: Personal history of traumatic brain injury: Z87.820

## 2021-10-26 HISTORY — DX: Personal history of other diseases of the respiratory system: Z87.09

## 2021-10-26 HISTORY — DX: Encounter for adoption services: Z02.82

## 2021-10-26 HISTORY — DX: Unspecified injury of unspecified lower leg, initial encounter: S89.90XA

## 2021-10-26 HISTORY — DX: Post-traumatic stress disorder, unspecified: F43.10

## 2021-10-26 HISTORY — DX: Presence of spectacles and contact lenses: Z97.3

## 2021-10-26 HISTORY — DX: Dependence on other enabling machines and devices: Z99.89

## 2021-10-26 LAB — POCT I-STAT, CHEM 8
BUN: 15 mg/dL (ref 8–23)
Calcium, Ion: 1.17 mmol/L (ref 1.15–1.40)
Chloride: 106 mmol/L (ref 98–111)
Creatinine, Ser: 1 mg/dL (ref 0.61–1.24)
Glucose, Bld: 145 mg/dL — ABNORMAL HIGH (ref 70–99)
HCT: 47 % (ref 39.0–52.0)
Hemoglobin: 16 g/dL (ref 13.0–17.0)
Potassium: 3.8 mmol/L (ref 3.5–5.1)
Sodium: 142 mmol/L (ref 135–145)
TCO2: 25 mmol/L (ref 22–32)

## 2021-10-26 SURGERY — INJECTION, HYDROGEL SPACER
Anesthesia: Monitor Anesthesia Care | Site: Prostate

## 2021-10-26 MED ORDER — OXYCODONE HCL 5 MG/5ML PO SOLN: 5.0000 mg | Freq: Once | ORAL | Status: DC | PRN

## 2021-10-26 MED ORDER — PROPOFOL 500 MG/50ML IV EMUL
INTRAVENOUS | Status: DC | PRN
  Administered 2021-10-26: 200 ug/kg/min via INTRAVENOUS

## 2021-10-26 MED ORDER — ONDANSETRON HCL 4 MG/2ML IJ SOLN
INTRAMUSCULAR | Status: AC
  Filled 2021-10-26: qty 2

## 2021-10-26 MED ORDER — ACETAMINOPHEN 325 MG PO TABS: 650.0000 mg | ORAL_TABLET | ORAL | Status: DC | PRN

## 2021-10-26 MED ORDER — OXYCODONE HCL 5 MG PO TABS: 5.0000 mg | ORAL_TABLET | Freq: Once | ORAL | Status: DC | PRN

## 2021-10-26 MED ORDER — MORPHINE SULFATE (PF) 4 MG/ML IV SOLN: 2.0000 mg | INTRAVENOUS | Status: DC | PRN

## 2021-10-26 MED ORDER — SODIUM CHLORIDE (PF) 0.9 % IJ SOLN
INTRAMUSCULAR | Status: DC | PRN
  Administered 2021-10-26: 10 mL

## 2021-10-26 MED ORDER — FENTANYL CITRATE (PF) 100 MCG/2ML IJ SOLN
INTRAMUSCULAR | Status: AC
Start: 2021-10-26 — End: ?
  Filled 2021-10-26: qty 2

## 2021-10-26 MED ORDER — LIDOCAINE HCL 2 % IJ SOLN
INTRAMUSCULAR | Status: DC | PRN
  Administered 2021-10-26: 7 mL

## 2021-10-26 MED ORDER — FENTANYL CITRATE (PF) 100 MCG/2ML IJ SOLN
INTRAMUSCULAR | Status: DC | PRN
  Administered 2021-10-26: 50 ug via INTRAVENOUS

## 2021-10-26 MED ORDER — ACETAMINOPHEN 325 MG RE SUPP: 650.0000 mg | RECTAL | Status: DC | PRN

## 2021-10-26 MED ORDER — PROPOFOL 10 MG/ML IV BOLUS
INTRAVENOUS | Status: DC | PRN
  Administered 2021-10-26 (×2): 20 mg via INTRAVENOUS

## 2021-10-26 MED ORDER — LACTATED RINGERS IV SOLN: INTRAVENOUS | Status: DC

## 2021-10-26 MED ORDER — CEFAZOLIN SODIUM-DEXTROSE 2-4 GM/100ML-% IV SOLN
2.0000 g | INTRAVENOUS | Status: AC
Start: 2021-10-26 — End: 2021-10-26
  Administered 2021-10-26: 2 g via INTRAVENOUS

## 2021-10-26 MED ORDER — SODIUM CHLORIDE 0.9% FLUSH: 3.0000 mL | Freq: Two times a day (BID) | INTRAVENOUS | Status: DC

## 2021-10-26 MED ORDER — FENTANYL CITRATE (PF) 100 MCG/2ML IJ SOLN: 25.0000 ug | INTRAMUSCULAR | Status: DC | PRN

## 2021-10-26 MED ORDER — MIDAZOLAM HCL 5 MG/5ML IJ SOLN
INTRAMUSCULAR | Status: DC | PRN
  Administered 2021-10-26: 2 mg via INTRAVENOUS

## 2021-10-26 MED ORDER — LIDOCAINE HCL URETHRAL/MUCOSAL 2 % EX GEL
CUTANEOUS | Status: DC | PRN
  Administered 2021-10-26: 1

## 2021-10-26 MED ORDER — ONDANSETRON HCL 4 MG/2ML IJ SOLN
INTRAMUSCULAR | Status: DC | PRN
  Administered 2021-10-26: 4 mg via INTRAVENOUS

## 2021-10-26 MED ORDER — SODIUM CHLORIDE 0.9 % IV SOLN: 250.0000 mL | INTRAVENOUS | Status: DC | PRN

## 2021-10-26 MED ORDER — OXYCODONE HCL 5 MG PO TABS: 5.0000 mg | ORAL_TABLET | ORAL | Status: DC | PRN

## 2021-10-26 MED ORDER — FLEET ENEMA 7-19 GM/118ML RE ENEM
1.0000 | ENEMA | Freq: Once | RECTAL | Status: DC
Start: 2021-10-26 — End: 2021-10-26

## 2021-10-26 MED ORDER — CEFAZOLIN SODIUM-DEXTROSE 2-4 GM/100ML-% IV SOLN
INTRAVENOUS | Status: AC
  Filled 2021-10-26: qty 100

## 2021-10-26 MED ORDER — SODIUM CHLORIDE 0.9% FLUSH: 3.0000 mL | INTRAVENOUS | Status: DC | PRN

## 2021-10-26 MED ORDER — MIDAZOLAM HCL 2 MG/2ML IJ SOLN
INTRAMUSCULAR | Status: AC
  Filled 2021-10-26: qty 2

## 2021-10-26 SURGICAL SUPPLY — 26 items
BLADE CLIPPER SENSICLIP SURGIC (BLADE) ×3 IMPLANT
CNTNR URN SCR LID CUP LEK RST (MISCELLANEOUS) ×2 IMPLANT
CONT SPEC 4OZ STRL OR WHT (MISCELLANEOUS) ×3
COVER BACK TABLE 60X90IN (DRAPES) ×3 IMPLANT
DRSG IV TEGADERM 3.5X4.5 STRL (GAUZE/BANDAGES/DRESSINGS) ×1 IMPLANT
DRSG TEGADERM 4X4.75 (GAUZE/BANDAGES/DRESSINGS) ×3 IMPLANT
DRSG TEGADERM 8X12 (GAUZE/BANDAGES/DRESSINGS) ×3 IMPLANT
GAUZE SPONGE 4X4 12PLY STRL (GAUZE/BANDAGES/DRESSINGS) ×3 IMPLANT
GLOVE ECLIPSE 8.0 STRL XLNG CF (GLOVE) ×3 IMPLANT
GLOVE SURG ORTHO 8.5 STRL (GLOVE) ×3 IMPLANT
GLOVE SURG SS PI 8.0 STRL IVOR (GLOVE) ×3 IMPLANT
IMPL SPACEOAR VUE SYSTEM (Spacer) ×2 IMPLANT
IMPLANT SPACEOAR VUE SYSTEM (Spacer) ×3 IMPLANT
KIT TURNOVER CYSTO (KITS) ×3 IMPLANT
MARKER GOLD PRELOAD 1.2X3 (Urological Implant) ×2 IMPLANT
MARKER SKIN DUAL TIP RULER LAB (MISCELLANEOUS) ×3 IMPLANT
NDL SPNL 22GX7 QUINCKE BK (NEEDLE) ×2 IMPLANT
NEEDLE SPNL 22GX7 QUINCKE BK (NEEDLE) ×3 IMPLANT
SEED GOLD PRELOAD 1.2X3 (Urological Implant) ×3 IMPLANT
SHEATH ULTRASOUND LF (SHEATH) IMPLANT
SHEATH ULTRASOUND LTX NONSTRL (SHEATH) IMPLANT
SURGILUBE 2OZ TUBE FLIPTOP (MISCELLANEOUS) ×3 IMPLANT
SYR 10ML LL (SYRINGE) IMPLANT
SYR CONTROL 10ML LL (SYRINGE) ×3 IMPLANT
TOWEL OR 17X26 10 PK STRL BLUE (TOWEL DISPOSABLE) ×3 IMPLANT
UNDERPAD 30X36 HEAVY ABSORB (UNDERPADS AND DIAPERS) ×3 IMPLANT

## 2021-10-26 NOTE — Anesthesia Preprocedure Evaluation (Addendum)
Anesthesia Evaluation  ?Patient identified by MRN, date of birth, ID band ?Patient awake ? ? ? ?Reviewed: ?Allergy & Precautions, NPO status , Patient's Chart, lab work & pertinent test results, reviewed documented beta blocker date and time  ? ?Airway ?Mallampati: II ? ?TM Distance: >3 FB ?Neck ROM: Full ? ? ? Dental ?no notable dental hx. ?(+) Teeth Intact, Dental Advisory Given ?  ?Pulmonary ?former smoker,  ?  ?Pulmonary exam normal ?breath sounds clear to auscultation ? ? ? ? ? ? Cardiovascular ?hypertension, Pt. on medications ?Normal cardiovascular exam ?Rhythm:Regular Rate:Normal ? 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  ?Neuro/Psych ?PSYCHIATRIC DISORDERS Anxiety Hx/o PTSDnegative neurological ROS ?   ? GI/Hepatic ?negative GI ROS, Neg liver ROS,   ?Endo/Other  ?Hyperlipidemia ?Obesity ? Renal/GU ?negative Renal ROS  ? ?Prostate Ca ? ?  ?Musculoskeletal ?negative musculoskeletal ROS ?(+)  ? Abdominal ?(+) + obese,   ?Peds ? Hematology ?negative hematology ROS ?(+)   ?Anesthesia Other Findings ? ? Reproductive/Obstetrics ? ?  ? ? ? ? ? ? ? ? ? ? ? ? ? ?  ?  ? ? ? ? ? ? ? ? ?Anesthesia Physical ?Anesthesia Plan ? ?ASA: 2 ? ?Anesthesia Plan: MAC  ? ?Post-op Pain Management:   ? ?Induction: Intravenous ? ?PONV Risk Score and Plan: 3 and Treatment may vary due to age or medical condition and Ondansetron ? ?Airway Management Planned: Natural Airway and Simple Face Mask ? ?Additional Equipment:  ? ?Intra-op Plan:  ? ?Post-operative Plan:  ? ?Informed Consent: I have reviewed the patients History and Physical, chart, labs and discussed the procedure including the risks, benefits and alternatives for the proposed anesthesia with the patient or authorized representative who has indicated his/her understanding and acceptance.  ? ? ? ?Dental advisory given ? ?Plan Discussed with: CRNA and Anesthesiologist ? ?Anesthesia Plan Comments:   ? ? ? ? ? ?Anesthesia Quick  Evaluation ? ?

## 2021-10-26 NOTE — Anesthesia Postprocedure Evaluation (Signed)
Anesthesia Post Note ? ?Patient: DAEJON LICH ? ?Procedure(s) Performed: SPACE OAR INSTILLATION (Perineum) ?GOLD SEED IMPLANT (Prostate) ? ?  ? ?Patient location during evaluation: PACU ?Anesthesia Type: MAC ?Level of consciousness: awake and alert and oriented ?Pain management: pain level controlled ?Vital Signs Assessment: post-procedure vital signs reviewed and stable ?Respiratory status: spontaneous breathing, nonlabored ventilation and respiratory function stable ?Cardiovascular status: stable and blood pressure returned to baseline ?Postop Assessment: no apparent nausea or vomiting ?Anesthetic complications: no ? ? ?No notable events documented. ? ?Last Vitals:  ?Vitals:  ? 10/26/21 0915 10/26/21 0930  ?BP: 118/87 (!) 133/91  ?Pulse: 68 60  ?Resp: (!) 22 12  ?Temp:  (!) 36.4 ?C  ?SpO2: 96% 97%  ?  ?Last Pain:  ?Vitals:  ? 10/26/21 0930  ?TempSrc:   ?PainSc: 0-No pain  ? ? ?  ?  ?  ?  ?  ?  ? ?Shantee Hayne A. ? ? ? ? ?

## 2021-10-26 NOTE — Op Note (Signed)
Procedure: 1.  Ultrasound-guided placement of gold fiducial markers. ?2.  Ultrasound-guided placement of SpaceOAR. ? ?Preop diagnosis: Prostate cancer. ? ?Postop diagnosis: Same. ? ?Surgeon: Dr. Irine Seal. ? ?Anesthesia: MAC and local. ? ?Drain: None. ? ?Specimen: None. ? ?EBL: None. ? ?Complications: None. ? ?Indications: The patient is a 61 year old male with prostate cancer who is to undergo placement of gold fiducial markers and SpaceOAR hydrogel in preparation for external beam radiation therapy. ? ?Procedure: He was given antibiotics.  He was taken operating room was placed in lithotomy position and sedation was given as needed.  His his genitals were secured anteriorly with a OpSite and towel.  His perineum was clipped.  He was prepped with Betadine solution. ? ?The B&K ultrasound probe was assembled and inserted and secured to the fixed base.  The prostate was positioned appropriately for imaging of both the sagittal and transverse planes. ? ?10 mL of 2% lidocaine was then injected using a spinal needle into the perineal skin and along the tract to the prostatic apex. ? ?The gold fiducial markers were then placed under ultrasound guidance.  Markers were placed at the right medial base, the right medial apex and the left mid lateral prostate in the standard configuration. ? ?The SpaceOAR needle was then passed in the midline at an angle into the post prostatic fat stripe and saline was then injected to confirm positioning and hydrodissect the space. ? ?The SpaceOAR polymer was prepared and then injected over approximately 12 seconds into the space with good distribution.  The ultrasound probe was removed.  The perineum was cleansed.  He was taken down from lithotomy position, his anesthetic was reversed and he was moved to recovery room in stable condition.  There were no complications. ?

## 2021-10-26 NOTE — Transfer of Care (Signed)
Immediate Anesthesia Transfer of Care Note ? ?Patient: Jerome Newton ? ?Procedure(s) Performed: SPACE OAR INSTILLATION (Perineum) ?GOLD SEED IMPLANT (Prostate) ? ?Patient Location: PACU ? ?Anesthesia Type:MAC ? ?Level of Consciousness: awake, alert , oriented and patient cooperative ? ?Airway & Oxygen Therapy: Patient Spontanous Breathing ? ?Post-op Assessment: Report given to RN and Post -op Vital signs reviewed and stable ? ?Post vital signs: Reviewed and stable ? ?Last Vitals:  ?Vitals Value Taken Time  ?BP 113/78 10/26/21 0900  ?Temp 36.4 ?C 10/26/21 0900  ?Pulse 67 10/26/21 0907  ?Resp 18 10/26/21 0907  ?SpO2 95 % 10/26/21 0907  ?Vitals shown include unvalidated device data. ? ?Last Pain:  ?Vitals:  ? 10/26/21 0900  ?TempSrc:   ?PainSc: 0-No pain  ?   ? ?Patients Stated Pain Goal: 8 (10/26/21 0706) ? ?Complications: No notable events documented. ?

## 2021-10-26 NOTE — Discharge Instructions (Addendum)

## 2021-10-29 ENCOUNTER — Encounter (HOSPITAL_BASED_OUTPATIENT_CLINIC_OR_DEPARTMENT_OTHER): Payer: Self-pay | Admitting: Urology

## 2021-10-31 ENCOUNTER — Telehealth: Payer: Self-pay | Admitting: *Deleted

## 2021-10-31 NOTE — Telephone Encounter (Signed)
CALLED PATIENT TO REMIND OF SIM APPT. FOR 11-01-21- ARRIVAL TIME- 10:45 AM @ [facility], PATIENT INFORMED TO ARRIVE WITH A FULL BLADDER AND AN EMPTY BOWEL, SPOKE WITH PATIENT AND HE IS  ?AWARE OF THIS APPT. ?

## 2021-11-01 ENCOUNTER — Other Ambulatory Visit: Payer: Self-pay

## 2021-11-01 ENCOUNTER — Ambulatory Visit
Admission: RE | Admit: 2021-11-01 | Discharge: 2021-11-01 | Disposition: A | Discharge status: Home / Self Care | Payer: Self-pay | Source: Ambulatory Visit | Attending: Radiation Oncology | Admitting: Radiation Oncology

## 2021-11-01 DIAGNOSIS — C61 Malignant neoplasm of prostate: Secondary | ICD-10-CM | POA: Insufficient documentation

## 2021-11-04 NOTE — Progress Notes (Signed)
?  Radiation Oncology         [phone] ?________________________________ ? ?Name: Jerome Newton MRN: [id number]  ?Date: 11/01/2021  DOB: [birth year] ? ?SIMULATION AND TREATMENT PLANNING NOTE ? ?  ICD-10-CM   ?1. Prostate cancer ([place])  C61   ?  ? ? ?DIAGNOSIS:  61 y.o. gentleman with stage T1c adenocarcinoma of the prostate with a Gleason's score of 3+3 and a PSA of 15.2 ? ?NARRATIVE:  The patient was brought to the [facility].  Identity was confirmed.  All relevant records and images related to the planned course of therapy were reviewed.  The patient freely provided informed written consent to proceed with treatment after reviewing the details related to the planned course of therapy. The consent form was witnessed and verified by the simulation staff.  Then, the patient was set-up in a stable reproducible supine position for radiation therapy.  A vacuum lock pillow device was custom fabricated to position his legs in a reproducible immobilized position.  Then, I performed a urethrogram under sterile conditions to identify the prostatic bed.  CT images were obtained.  Surface markings were placed.  The CT images were loaded into the planning software.  Then the prostate bed target, pelvic lymph node target and avoidance structures including the rectum, bladder, bowel and hips were contoured.  Treatment planning then occurred.  The radiation prescription was entered and confirmed.  A total of one complex treatment devices were fabricated. I have requested : Intensity Modulated Radiotherapy (IMRT) is medically necessary for this case for the following reason:  Rectal sparing.. ? ?PLAN:  The patient will receive 45 Gy in 25 fractions of 1.8 Gy, followed by a boost to the prostate to a total dose of 75 Gy with 15 additional fractions of 2 Gy. ? ? ?________________________________ ? ?Sheral Apley Tammi Klippel, M.D. ? ?

## 2021-11-14 ENCOUNTER — Other Ambulatory Visit: Payer: Self-pay

## 2021-11-14 ENCOUNTER — Ambulatory Visit
Admission: RE | Admit: 2021-11-14 | Discharge: 2021-11-14 | Disposition: A | Discharge status: Home / Self Care | Payer: No Typology Code available for payment source | Source: Ambulatory Visit | Attending: Radiation Oncology | Admitting: Radiation Oncology

## 2021-11-14 DIAGNOSIS — C61 Malignant neoplasm of prostate: Secondary | ICD-10-CM | POA: Insufficient documentation

## 2021-11-14 LAB — RAD ONC ARIA SESSION SUMMARY
Course Elapsed Days: 0
Plan Fractions Treated to Date: 1
Plan Prescribed Dose Per Fraction: 1.8 Gy
Plan Total Fractions Prescribed: 25
Plan Total Prescribed Dose: 45 Gy
Reference Point Dosage Given to Date: 1.8 Gy
Reference Point Session Dosage Given: 1.8 Gy
Session Number: 1

## 2021-11-15 ENCOUNTER — Ambulatory Visit
Admission: RE | Admit: 2021-11-15 | Discharge: 2021-11-15 | Disposition: A | Discharge status: Home / Self Care | Payer: No Typology Code available for payment source | Source: Ambulatory Visit | Attending: Radiation Oncology | Admitting: Radiation Oncology

## 2021-11-15 ENCOUNTER — Other Ambulatory Visit: Payer: Self-pay

## 2021-11-15 LAB — RAD ONC ARIA SESSION SUMMARY
Course Elapsed Days: 1
Plan Fractions Treated to Date: 2
Plan Prescribed Dose Per Fraction: 1.8 Gy
Plan Total Fractions Prescribed: 25
Plan Total Prescribed Dose: 45 Gy
Reference Point Dosage Given to Date: 3.6 Gy
Reference Point Session Dosage Given: 1.8 Gy
Session Number: 2

## 2021-11-16 ENCOUNTER — Ambulatory Visit: Payer: No Typology Code available for payment source

## 2021-11-19 ENCOUNTER — Other Ambulatory Visit: Payer: Self-pay

## 2021-11-19 ENCOUNTER — Ambulatory Visit
Admission: RE | Admit: 2021-11-19 | Discharge: 2021-11-19 | Disposition: A | Discharge status: Home / Self Care | Payer: No Typology Code available for payment source | Source: Ambulatory Visit | Attending: Radiation Oncology | Admitting: Radiation Oncology

## 2021-11-19 LAB — RAD ONC ARIA SESSION SUMMARY
Course Elapsed Days: 5
Plan Fractions Treated to Date: 3
Plan Prescribed Dose Per Fraction: 1.8 Gy
Plan Total Fractions Prescribed: 25
Plan Total Prescribed Dose: 45 Gy
Reference Point Dosage Given to Date: 5.4 Gy
Reference Point Session Dosage Given: 1.8 Gy
Session Number: 3

## 2021-11-20 ENCOUNTER — Other Ambulatory Visit: Payer: Self-pay

## 2021-11-20 ENCOUNTER — Ambulatory Visit
Admission: RE | Admit: 2021-11-20 | Discharge: 2021-11-20 | Disposition: A | Discharge status: Home / Self Care | Payer: No Typology Code available for payment source | Source: Ambulatory Visit | Attending: Radiation Oncology | Admitting: Radiation Oncology

## 2021-11-20 LAB — RAD ONC ARIA SESSION SUMMARY
Course Elapsed Days: 6
Plan Fractions Treated to Date: 4
Plan Prescribed Dose Per Fraction: 1.8 Gy
Plan Total Fractions Prescribed: 25
Plan Total Prescribed Dose: 45 Gy
Reference Point Dosage Given to Date: 7.2 Gy
Reference Point Session Dosage Given: 1.8 Gy
Session Number: 4

## 2021-11-21 ENCOUNTER — Other Ambulatory Visit: Payer: Self-pay

## 2021-11-21 ENCOUNTER — Ambulatory Visit
Admission: RE | Admit: 2021-11-21 | Discharge: 2021-11-21 | Disposition: A | Discharge status: Home / Self Care | Payer: No Typology Code available for payment source | Source: Ambulatory Visit | Attending: Radiation Oncology | Admitting: Radiation Oncology

## 2021-11-21 LAB — RAD ONC ARIA SESSION SUMMARY
Course Elapsed Days: 7
Plan Fractions Treated to Date: 5
Plan Prescribed Dose Per Fraction: 1.8 Gy
Plan Total Fractions Prescribed: 25
Plan Total Prescribed Dose: 45 Gy
Reference Point Dosage Given to Date: 9 Gy
Reference Point Session Dosage Given: 1.8 Gy
Session Number: 5

## 2021-11-22 ENCOUNTER — Ambulatory Visit
Admission: RE | Admit: 2021-11-22 | Discharge: 2021-11-22 | Disposition: A | Discharge status: Home / Self Care | Payer: No Typology Code available for payment source | Source: Ambulatory Visit | Attending: Radiation Oncology | Admitting: Radiation Oncology

## 2021-11-22 ENCOUNTER — Other Ambulatory Visit: Payer: Self-pay

## 2021-11-22 LAB — RAD ONC ARIA SESSION SUMMARY
Course Elapsed Days: 8
Plan Fractions Treated to Date: 6
Plan Prescribed Dose Per Fraction: 1.8 Gy
Plan Total Fractions Prescribed: 25
Plan Total Prescribed Dose: 45 Gy
Reference Point Dosage Given to Date: 10.8 Gy
Reference Point Session Dosage Given: 1.8 Gy
Session Number: 6

## 2021-11-23 ENCOUNTER — Ambulatory Visit
Admission: RE | Admit: 2021-11-23 | Discharge: 2021-11-23 | Disposition: A | Discharge status: Home / Self Care | Payer: No Typology Code available for payment source | Source: Ambulatory Visit | Attending: Radiation Oncology | Admitting: Radiation Oncology

## 2021-11-23 ENCOUNTER — Other Ambulatory Visit: Payer: Self-pay

## 2021-11-23 LAB — RAD ONC ARIA SESSION SUMMARY
Course Elapsed Days: 9
Plan Fractions Treated to Date: 7
Plan Prescribed Dose Per Fraction: 1.8 Gy
Plan Total Fractions Prescribed: 25
Plan Total Prescribed Dose: 45 Gy
Reference Point Dosage Given to Date: 12.6 Gy
Reference Point Session Dosage Given: 1.8 Gy
Session Number: 7

## 2021-11-26 ENCOUNTER — Other Ambulatory Visit: Payer: Self-pay

## 2021-11-26 ENCOUNTER — Ambulatory Visit
Admission: RE | Admit: 2021-11-26 | Discharge: 2021-11-26 | Disposition: A | Discharge status: Home / Self Care | Payer: No Typology Code available for payment source | Source: Ambulatory Visit | Attending: Radiation Oncology | Admitting: Radiation Oncology

## 2021-11-26 LAB — RAD ONC ARIA SESSION SUMMARY
Course Elapsed Days: 12
Plan Fractions Treated to Date: 8
Plan Prescribed Dose Per Fraction: 1.8 Gy
Plan Total Fractions Prescribed: 25
Plan Total Prescribed Dose: 45 Gy
Reference Point Dosage Given to Date: 14.4 Gy
Reference Point Session Dosage Given: 1.8 Gy
Session Number: 8

## 2021-11-27 ENCOUNTER — Other Ambulatory Visit: Payer: Self-pay

## 2021-11-27 ENCOUNTER — Ambulatory Visit
Admission: RE | Admit: 2021-11-27 | Discharge: 2021-11-27 | Disposition: A | Discharge status: Home / Self Care | Payer: No Typology Code available for payment source | Source: Ambulatory Visit | Attending: Radiation Oncology | Admitting: Radiation Oncology

## 2021-11-27 LAB — RAD ONC ARIA SESSION SUMMARY
Course Elapsed Days: 13
Plan Fractions Treated to Date: 9
Plan Prescribed Dose Per Fraction: 1.8 Gy
Plan Total Fractions Prescribed: 25
Plan Total Prescribed Dose: 45 Gy
Reference Point Dosage Given to Date: 16.2 Gy
Reference Point Session Dosage Given: 1.8 Gy
Session Number: 9

## 2021-11-28 ENCOUNTER — Ambulatory Visit
Admission: RE | Admit: 2021-11-28 | Discharge: 2021-11-28 | Disposition: A | Discharge status: Home / Self Care | Payer: No Typology Code available for payment source | Source: Ambulatory Visit | Attending: Radiation Oncology | Admitting: Radiation Oncology

## 2021-11-28 ENCOUNTER — Other Ambulatory Visit: Payer: Self-pay

## 2021-11-28 LAB — RAD ONC ARIA SESSION SUMMARY
Course Elapsed Days: 14
Plan Fractions Treated to Date: 10
Plan Prescribed Dose Per Fraction: 1.8 Gy
Plan Total Fractions Prescribed: 25
Plan Total Prescribed Dose: 45 Gy
Reference Point Dosage Given to Date: 18 Gy
Reference Point Session Dosage Given: 1.8 Gy
Session Number: 10

## 2021-11-29 ENCOUNTER — Ambulatory Visit
Admission: RE | Admit: 2021-11-29 | Discharge: 2021-11-29 | Disposition: A | Discharge status: Home / Self Care | Payer: No Typology Code available for payment source | Source: Ambulatory Visit | Attending: Radiation Oncology | Admitting: Radiation Oncology

## 2021-11-29 ENCOUNTER — Other Ambulatory Visit: Payer: Self-pay

## 2021-11-29 LAB — RAD ONC ARIA SESSION SUMMARY
Course Elapsed Days: 15
Plan Fractions Treated to Date: 11
Plan Prescribed Dose Per Fraction: 1.8 Gy
Plan Total Fractions Prescribed: 25
Plan Total Prescribed Dose: 45 Gy
Reference Point Dosage Given to Date: 19.8 Gy
Reference Point Session Dosage Given: 1.8 Gy
Session Number: 11

## 2021-11-30 ENCOUNTER — Ambulatory Visit
Admission: RE | Admit: 2021-11-30 | Discharge: 2021-11-30 | Disposition: A | Discharge status: Home / Self Care | Payer: No Typology Code available for payment source | Source: Ambulatory Visit | Attending: Radiation Oncology | Admitting: Radiation Oncology

## 2021-11-30 ENCOUNTER — Other Ambulatory Visit: Payer: Self-pay

## 2021-11-30 LAB — RAD ONC ARIA SESSION SUMMARY
Course Elapsed Days: 16
Plan Fractions Treated to Date: 12
Plan Prescribed Dose Per Fraction: 1.8 Gy
Plan Total Fractions Prescribed: 25
Plan Total Prescribed Dose: 45 Gy
Reference Point Dosage Given to Date: 21.6 Gy
Reference Point Session Dosage Given: 1.8 Gy
Session Number: 12

## 2021-12-03 ENCOUNTER — Ambulatory Visit
Admission: RE | Admit: 2021-12-03 | Discharge: 2021-12-03 | Disposition: A | Discharge status: Home / Self Care | Payer: No Typology Code available for payment source | Source: Ambulatory Visit | Attending: Radiation Oncology | Admitting: Radiation Oncology

## 2021-12-03 ENCOUNTER — Other Ambulatory Visit: Payer: Self-pay

## 2021-12-03 LAB — RAD ONC ARIA SESSION SUMMARY
Course Elapsed Days: 19
Plan Fractions Treated to Date: 13
Plan Prescribed Dose Per Fraction: 1.8 Gy
Plan Total Fractions Prescribed: 25
Plan Total Prescribed Dose: 45 Gy
Reference Point Dosage Given to Date: 23.4 Gy
Reference Point Session Dosage Given: 1.8 Gy
Session Number: 13

## 2021-12-04 ENCOUNTER — Other Ambulatory Visit: Payer: Self-pay

## 2021-12-04 ENCOUNTER — Ambulatory Visit
Admission: RE | Admit: 2021-12-04 | Discharge: 2021-12-04 | Disposition: A | Discharge status: Home / Self Care | Payer: No Typology Code available for payment source | Source: Ambulatory Visit | Attending: Radiation Oncology | Admitting: Radiation Oncology

## 2021-12-04 LAB — RAD ONC ARIA SESSION SUMMARY
Course Elapsed Days: 20
Plan Fractions Treated to Date: 14
Plan Prescribed Dose Per Fraction: 1.8 Gy
Plan Total Fractions Prescribed: 25
Plan Total Prescribed Dose: 45 Gy
Reference Point Dosage Given to Date: 25.2 Gy
Reference Point Session Dosage Given: 1.8 Gy
Session Number: 14

## 2021-12-05 ENCOUNTER — Other Ambulatory Visit: Payer: Self-pay

## 2021-12-05 ENCOUNTER — Ambulatory Visit
Admission: RE | Admit: 2021-12-05 | Discharge: 2021-12-05 | Disposition: A | Discharge status: Home / Self Care | Payer: No Typology Code available for payment source | Source: Ambulatory Visit | Attending: Radiation Oncology | Admitting: Radiation Oncology

## 2021-12-05 LAB — RAD ONC ARIA SESSION SUMMARY
Course Elapsed Days: 21
Plan Fractions Treated to Date: 15
Plan Prescribed Dose Per Fraction: 1.8 Gy
Plan Total Fractions Prescribed: 25
Plan Total Prescribed Dose: 45 Gy
Reference Point Dosage Given to Date: 27 Gy
Reference Point Session Dosage Given: 1.8 Gy
Session Number: 15

## 2021-12-06 ENCOUNTER — Ambulatory Visit
Admission: RE | Admit: 2021-12-06 | Discharge: 2021-12-06 | Disposition: A | Discharge status: Home / Self Care | Payer: No Typology Code available for payment source | Source: Ambulatory Visit | Attending: Radiation Oncology | Admitting: Radiation Oncology

## 2021-12-06 ENCOUNTER — Other Ambulatory Visit: Payer: Self-pay

## 2021-12-06 LAB — RAD ONC ARIA SESSION SUMMARY
Course Elapsed Days: 22
Plan Fractions Treated to Date: 16
Plan Prescribed Dose Per Fraction: 1.8 Gy
Plan Total Fractions Prescribed: 25
Plan Total Prescribed Dose: 45 Gy
Reference Point Dosage Given to Date: 28.8 Gy
Reference Point Session Dosage Given: 1.8 Gy
Session Number: 16

## 2021-12-07 ENCOUNTER — Other Ambulatory Visit: Payer: Self-pay

## 2021-12-07 ENCOUNTER — Other Ambulatory Visit: Payer: Self-pay | Admitting: Radiation Oncology

## 2021-12-07 ENCOUNTER — Ambulatory Visit
Admission: RE | Admit: 2021-12-07 | Discharge: 2021-12-07 | Disposition: A | Discharge status: Home / Self Care | Payer: No Typology Code available for payment source | Source: Ambulatory Visit | Attending: Radiation Oncology | Admitting: Radiation Oncology

## 2021-12-07 LAB — RAD ONC ARIA SESSION SUMMARY
Course Elapsed Days: 23
Plan Fractions Treated to Date: 17
Plan Prescribed Dose Per Fraction: 1.8 Gy
Plan Total Fractions Prescribed: 25
Plan Total Prescribed Dose: 45 Gy
Reference Point Dosage Given to Date: 30.6 Gy
Reference Point Session Dosage Given: 1.8 Gy
Session Number: 17

## 2021-12-07 MED ORDER — TAMSULOSIN HCL 0.4 MG PO CAPS: 0.4000 mg | ORAL_CAPSULE | Freq: Every day | ORAL | 5 refills | Status: DC

## 2021-12-11 ENCOUNTER — Ambulatory Visit
Admission: RE | Admit: 2021-12-11 | Discharge: 2021-12-11 | Disposition: A | Discharge status: Home / Self Care | Payer: No Typology Code available for payment source | Source: Ambulatory Visit | Attending: Radiation Oncology | Admitting: Radiation Oncology

## 2021-12-11 ENCOUNTER — Other Ambulatory Visit: Payer: Self-pay

## 2021-12-11 LAB — RAD ONC ARIA SESSION SUMMARY
Course Elapsed Days: 27
Plan Fractions Treated to Date: 18
Plan Prescribed Dose Per Fraction: 1.8 Gy
Plan Total Fractions Prescribed: 25
Plan Total Prescribed Dose: 45 Gy
Reference Point Dosage Given to Date: 32.4 Gy
Reference Point Session Dosage Given: 1.8 Gy
Session Number: 18

## 2021-12-12 ENCOUNTER — Other Ambulatory Visit: Payer: Self-pay

## 2021-12-12 ENCOUNTER — Ambulatory Visit
Admission: RE | Admit: 2021-12-12 | Discharge: 2021-12-12 | Disposition: A | Discharge status: Home / Self Care | Payer: No Typology Code available for payment source | Source: Ambulatory Visit | Attending: Radiation Oncology | Admitting: Radiation Oncology

## 2021-12-12 LAB — RAD ONC ARIA SESSION SUMMARY
Course Elapsed Days: 28
Plan Fractions Treated to Date: 19
Plan Prescribed Dose Per Fraction: 1.8 Gy
Plan Total Fractions Prescribed: 25
Plan Total Prescribed Dose: 45 Gy
Reference Point Dosage Given to Date: 34.2 Gy
Reference Point Session Dosage Given: 1.8 Gy
Session Number: 19

## 2021-12-13 ENCOUNTER — Other Ambulatory Visit: Payer: Self-pay

## 2021-12-13 ENCOUNTER — Ambulatory Visit
Admission: RE | Admit: 2021-12-13 | Discharge: 2021-12-13 | Disposition: A | Discharge status: Home / Self Care | Payer: No Typology Code available for payment source | Source: Ambulatory Visit | Attending: Radiation Oncology | Admitting: Radiation Oncology

## 2021-12-13 DIAGNOSIS — C61 Malignant neoplasm of prostate: Secondary | ICD-10-CM | POA: Insufficient documentation

## 2021-12-13 LAB — RAD ONC ARIA SESSION SUMMARY
Course Elapsed Days: 29
Plan Fractions Treated to Date: 20
Plan Prescribed Dose Per Fraction: 1.8 Gy
Plan Total Fractions Prescribed: 25
Plan Total Prescribed Dose: 45 Gy
Reference Point Dosage Given to Date: 36 Gy
Reference Point Session Dosage Given: 1.8 Gy
Session Number: 20

## 2021-12-14 ENCOUNTER — Ambulatory Visit
Admission: RE | Admit: 2021-12-14 | Discharge: 2021-12-14 | Disposition: A | Discharge status: Home / Self Care | Payer: No Typology Code available for payment source | Source: Ambulatory Visit | Attending: Radiation Oncology | Admitting: Radiation Oncology

## 2021-12-14 ENCOUNTER — Other Ambulatory Visit: Payer: Self-pay

## 2021-12-14 LAB — RAD ONC ARIA SESSION SUMMARY
Course Elapsed Days: 30
Plan Fractions Treated to Date: 21
Plan Prescribed Dose Per Fraction: 1.8 Gy
Plan Total Fractions Prescribed: 25
Plan Total Prescribed Dose: 45 Gy
Reference Point Dosage Given to Date: 37.8 Gy
Reference Point Session Dosage Given: 1.8 Gy
Session Number: 21

## 2021-12-17 ENCOUNTER — Other Ambulatory Visit: Payer: Self-pay

## 2021-12-17 ENCOUNTER — Ambulatory Visit
Admission: RE | Admit: 2021-12-17 | Discharge: 2021-12-17 | Disposition: A | Discharge status: Home / Self Care | Payer: No Typology Code available for payment source | Source: Ambulatory Visit | Attending: Radiation Oncology | Admitting: Radiation Oncology

## 2021-12-17 LAB — RAD ONC ARIA SESSION SUMMARY
Course Elapsed Days: 33
Plan Fractions Treated to Date: 22
Plan Prescribed Dose Per Fraction: 1.8 Gy
Plan Total Fractions Prescribed: 25
Plan Total Prescribed Dose: 45 Gy
Reference Point Dosage Given to Date: 39.6 Gy
Reference Point Session Dosage Given: 1.8 Gy
Session Number: 22

## 2021-12-18 ENCOUNTER — Other Ambulatory Visit: Payer: Self-pay

## 2021-12-18 ENCOUNTER — Ambulatory Visit
Admission: RE | Admit: 2021-12-18 | Discharge: 2021-12-18 | Disposition: A | Discharge status: Home / Self Care | Payer: No Typology Code available for payment source | Source: Ambulatory Visit | Attending: Radiation Oncology | Admitting: Radiation Oncology

## 2021-12-18 LAB — RAD ONC ARIA SESSION SUMMARY
Course Elapsed Days: 34
Plan Fractions Treated to Date: 23
Plan Prescribed Dose Per Fraction: 1.8 Gy
Plan Total Fractions Prescribed: 25
Plan Total Prescribed Dose: 45 Gy
Reference Point Dosage Given to Date: 41.4 Gy
Reference Point Session Dosage Given: 1.8 Gy
Session Number: 23

## 2021-12-19 ENCOUNTER — Other Ambulatory Visit: Payer: Self-pay

## 2021-12-19 ENCOUNTER — Ambulatory Visit
Admission: RE | Admit: 2021-12-19 | Discharge: 2021-12-19 | Disposition: A | Discharge status: Home / Self Care | Payer: No Typology Code available for payment source | Source: Ambulatory Visit | Attending: Radiation Oncology | Admitting: Radiation Oncology

## 2021-12-19 LAB — RAD ONC ARIA SESSION SUMMARY
Course Elapsed Days: 35
Plan Fractions Treated to Date: 24
Plan Prescribed Dose Per Fraction: 1.8 Gy
Plan Total Fractions Prescribed: 25
Plan Total Prescribed Dose: 45 Gy
Reference Point Dosage Given to Date: 43.2 Gy
Reference Point Session Dosage Given: 1.8 Gy
Session Number: 24

## 2021-12-20 ENCOUNTER — Ambulatory Visit
Admission: RE | Admit: 2021-12-20 | Discharge: 2021-12-20 | Disposition: A | Discharge status: Home / Self Care | Payer: No Typology Code available for payment source | Source: Ambulatory Visit | Attending: Radiation Oncology | Admitting: Radiation Oncology

## 2021-12-20 ENCOUNTER — Ambulatory Visit: Payer: No Typology Code available for payment source

## 2021-12-20 ENCOUNTER — Other Ambulatory Visit: Payer: Self-pay

## 2021-12-20 LAB — RAD ONC ARIA SESSION SUMMARY
Course Elapsed Days: 36
Plan Fractions Treated to Date: 25
Plan Prescribed Dose Per Fraction: 1.8 Gy
Plan Total Fractions Prescribed: 25
Plan Total Prescribed Dose: 45 Gy
Reference Point Dosage Given to Date: 45 Gy
Reference Point Session Dosage Given: 1.8 Gy
Session Number: 25

## 2021-12-21 ENCOUNTER — Ambulatory Visit
Admission: RE | Admit: 2021-12-21 | Discharge: 2021-12-21 | Disposition: A | Discharge status: Home / Self Care | Payer: No Typology Code available for payment source | Source: Ambulatory Visit | Attending: Radiation Oncology | Admitting: Radiation Oncology

## 2021-12-21 ENCOUNTER — Other Ambulatory Visit: Payer: Self-pay

## 2021-12-21 ENCOUNTER — Ambulatory Visit: Payer: No Typology Code available for payment source

## 2021-12-21 LAB — RAD ONC ARIA SESSION SUMMARY
Course Elapsed Days: 37
Plan Fractions Treated to Date: 1
Plan Prescribed Dose Per Fraction: 2 Gy
Plan Total Fractions Prescribed: 15
Plan Total Prescribed Dose: 30 Gy
Reference Point Dosage Given to Date: 47 Gy
Reference Point Session Dosage Given: 2 Gy
Session Number: 26

## 2021-12-24 ENCOUNTER — Other Ambulatory Visit: Payer: Self-pay

## 2021-12-24 ENCOUNTER — Ambulatory Visit
Admission: RE | Admit: 2021-12-24 | Discharge: 2021-12-24 | Disposition: A | Discharge status: Home / Self Care | Payer: No Typology Code available for payment source | Source: Ambulatory Visit | Attending: Radiation Oncology | Admitting: Radiation Oncology

## 2021-12-24 LAB — RAD ONC ARIA SESSION SUMMARY
Course Elapsed Days: 40
Plan Fractions Treated to Date: 2
Plan Prescribed Dose Per Fraction: 2 Gy
Plan Total Fractions Prescribed: 15
Plan Total Prescribed Dose: 30 Gy
Reference Point Dosage Given to Date: 49 Gy
Reference Point Session Dosage Given: 2 Gy
Session Number: 27

## 2021-12-25 ENCOUNTER — Other Ambulatory Visit: Payer: Self-pay

## 2021-12-25 ENCOUNTER — Ambulatory Visit
Admission: RE | Admit: 2021-12-25 | Discharge: 2021-12-25 | Disposition: A | Discharge status: Home / Self Care | Payer: No Typology Code available for payment source | Source: Ambulatory Visit | Attending: Radiation Oncology | Admitting: Radiation Oncology

## 2021-12-25 LAB — RAD ONC ARIA SESSION SUMMARY
Course Elapsed Days: 41
Plan Fractions Treated to Date: 3
Plan Prescribed Dose Per Fraction: 2 Gy
Plan Total Fractions Prescribed: 15
Plan Total Prescribed Dose: 30 Gy
Reference Point Dosage Given to Date: 51 Gy
Reference Point Session Dosage Given: 2 Gy
Session Number: 28

## 2021-12-26 ENCOUNTER — Other Ambulatory Visit: Payer: Self-pay

## 2021-12-26 ENCOUNTER — Ambulatory Visit
Admission: RE | Admit: 2021-12-26 | Discharge: 2021-12-26 | Disposition: A | Discharge status: Home / Self Care | Payer: No Typology Code available for payment source | Source: Ambulatory Visit | Attending: Radiation Oncology | Admitting: Radiation Oncology

## 2021-12-26 LAB — RAD ONC ARIA SESSION SUMMARY
Course Elapsed Days: 42
Plan Fractions Treated to Date: 4
Plan Prescribed Dose Per Fraction: 2 Gy
Plan Total Fractions Prescribed: 15
Plan Total Prescribed Dose: 30 Gy
Reference Point Dosage Given to Date: 53 Gy
Reference Point Session Dosage Given: 2 Gy
Session Number: 29

## 2021-12-27 ENCOUNTER — Ambulatory Visit
Admission: RE | Admit: 2021-12-27 | Discharge: 2021-12-27 | Disposition: A | Discharge status: Home / Self Care | Payer: No Typology Code available for payment source | Source: Ambulatory Visit | Attending: Radiation Oncology | Admitting: Radiation Oncology

## 2021-12-27 ENCOUNTER — Other Ambulatory Visit: Payer: Self-pay

## 2021-12-27 LAB — RAD ONC ARIA SESSION SUMMARY
Course Elapsed Days: 43
Plan Fractions Treated to Date: 5
Plan Prescribed Dose Per Fraction: 2 Gy
Plan Total Fractions Prescribed: 15
Plan Total Prescribed Dose: 30 Gy
Reference Point Dosage Given to Date: 55 Gy
Reference Point Session Dosage Given: 2 Gy
Session Number: 30

## 2021-12-28 ENCOUNTER — Ambulatory Visit: Payer: No Typology Code available for payment source

## 2021-12-28 ENCOUNTER — Other Ambulatory Visit: Payer: Self-pay

## 2021-12-28 ENCOUNTER — Ambulatory Visit
Admission: RE | Admit: 2021-12-28 | Discharge: 2021-12-28 | Disposition: A | Discharge status: Home / Self Care | Payer: No Typology Code available for payment source | Source: Ambulatory Visit | Attending: Radiation Oncology | Admitting: Radiation Oncology

## 2021-12-28 LAB — RAD ONC ARIA SESSION SUMMARY
Course Elapsed Days: 44
Plan Fractions Treated to Date: 6
Plan Prescribed Dose Per Fraction: 2 Gy
Plan Total Fractions Prescribed: 15
Plan Total Prescribed Dose: 30 Gy
Reference Point Dosage Given to Date: 57 Gy
Reference Point Session Dosage Given: 2 Gy
Session Number: 31

## 2021-12-31 ENCOUNTER — Other Ambulatory Visit: Payer: Self-pay

## 2021-12-31 ENCOUNTER — Ambulatory Visit
Admission: RE | Admit: 2021-12-31 | Discharge: 2021-12-31 | Disposition: A | Discharge status: Home / Self Care | Payer: No Typology Code available for payment source | Source: Ambulatory Visit | Attending: Radiation Oncology | Admitting: Radiation Oncology

## 2021-12-31 LAB — RAD ONC ARIA SESSION SUMMARY
Course Elapsed Days: 47
Plan Fractions Treated to Date: 7
Plan Prescribed Dose Per Fraction: 2 Gy
Plan Total Fractions Prescribed: 15
Plan Total Prescribed Dose: 30 Gy
Reference Point Dosage Given to Date: 59 Gy
Reference Point Session Dosage Given: 2 Gy
Session Number: 32

## 2022-01-01 ENCOUNTER — Ambulatory Visit
Admission: RE | Admit: 2022-01-01 | Discharge: 2022-01-01 | Disposition: A | Discharge status: Home / Self Care | Payer: No Typology Code available for payment source | Source: Ambulatory Visit | Attending: Radiation Oncology | Admitting: Radiation Oncology

## 2022-01-01 ENCOUNTER — Other Ambulatory Visit: Payer: Self-pay

## 2022-01-01 LAB — RAD ONC ARIA SESSION SUMMARY
Course Elapsed Days: 48
Plan Fractions Treated to Date: 8
Plan Prescribed Dose Per Fraction: 2 Gy
Plan Total Fractions Prescribed: 15
Plan Total Prescribed Dose: 30 Gy
Reference Point Dosage Given to Date: 61 Gy
Reference Point Session Dosage Given: 2 Gy
Session Number: 33

## 2022-01-02 ENCOUNTER — Other Ambulatory Visit: Payer: Self-pay

## 2022-01-02 ENCOUNTER — Ambulatory Visit
Admission: RE | Admit: 2022-01-02 | Discharge: 2022-01-02 | Disposition: A | Discharge status: Home / Self Care | Payer: No Typology Code available for payment source | Source: Ambulatory Visit | Attending: Radiation Oncology | Admitting: Radiation Oncology

## 2022-01-02 LAB — RAD ONC ARIA SESSION SUMMARY
Course Elapsed Days: 49
Plan Fractions Treated to Date: 9
Plan Prescribed Dose Per Fraction: 2 Gy
Plan Total Fractions Prescribed: 15
Plan Total Prescribed Dose: 30 Gy
Reference Point Dosage Given to Date: 63 Gy
Reference Point Session Dosage Given: 2 Gy
Session Number: 34

## 2022-01-03 ENCOUNTER — Other Ambulatory Visit: Payer: Self-pay

## 2022-01-03 ENCOUNTER — Ambulatory Visit
Admission: RE | Admit: 2022-01-03 | Discharge: 2022-01-03 | Disposition: A | Discharge status: Home / Self Care | Payer: No Typology Code available for payment source | Source: Ambulatory Visit | Attending: Radiation Oncology | Admitting: Radiation Oncology

## 2022-01-03 LAB — RAD ONC ARIA SESSION SUMMARY
Course Elapsed Days: 50
Plan Fractions Treated to Date: 10
Plan Prescribed Dose Per Fraction: 2 Gy
Plan Total Fractions Prescribed: 15
Plan Total Prescribed Dose: 30 Gy
Reference Point Dosage Given to Date: 65 Gy
Reference Point Session Dosage Given: 2 Gy
Session Number: 35

## 2022-01-04 ENCOUNTER — Ambulatory Visit
Admission: RE | Admit: 2022-01-04 | Discharge: 2022-01-04 | Disposition: A | Discharge status: Home / Self Care | Payer: No Typology Code available for payment source | Source: Ambulatory Visit | Attending: Radiation Oncology | Admitting: Radiation Oncology

## 2022-01-04 ENCOUNTER — Other Ambulatory Visit: Payer: Self-pay

## 2022-01-04 LAB — RAD ONC ARIA SESSION SUMMARY
Course Elapsed Days: 51
Plan Fractions Treated to Date: 11
Plan Prescribed Dose Per Fraction: 2 Gy
Plan Total Fractions Prescribed: 15
Plan Total Prescribed Dose: 30 Gy
Reference Point Dosage Given to Date: 67 Gy
Reference Point Session Dosage Given: 2 Gy
Session Number: 36

## 2022-01-07 ENCOUNTER — Other Ambulatory Visit: Payer: Self-pay

## 2022-01-07 ENCOUNTER — Ambulatory Visit
Admission: RE | Admit: 2022-01-07 | Discharge: 2022-01-07 | Disposition: A | Discharge status: Home / Self Care | Payer: No Typology Code available for payment source | Source: Ambulatory Visit | Attending: Radiation Oncology | Admitting: Radiation Oncology

## 2022-01-07 LAB — RAD ONC ARIA SESSION SUMMARY
Course Elapsed Days: 54
Plan Fractions Treated to Date: 12
Plan Prescribed Dose Per Fraction: 2 Gy
Plan Total Fractions Prescribed: 15
Plan Total Prescribed Dose: 30 Gy
Reference Point Dosage Given to Date: 69 Gy
Reference Point Session Dosage Given: 2 Gy
Session Number: 37

## 2022-01-08 ENCOUNTER — Ambulatory Visit
Admission: RE | Admit: 2022-01-08 | Discharge: 2022-01-08 | Disposition: A | Discharge status: Home / Self Care | Payer: No Typology Code available for payment source | Source: Ambulatory Visit | Attending: Radiation Oncology | Admitting: Radiation Oncology

## 2022-01-08 ENCOUNTER — Other Ambulatory Visit: Payer: Self-pay

## 2022-01-08 LAB — RAD ONC ARIA SESSION SUMMARY
Course Elapsed Days: 55
Plan Fractions Treated to Date: 13
Plan Prescribed Dose Per Fraction: 2 Gy
Plan Total Fractions Prescribed: 15
Plan Total Prescribed Dose: 30 Gy
Reference Point Dosage Given to Date: 71 Gy
Reference Point Session Dosage Given: 2 Gy
Session Number: 38

## 2022-01-09 ENCOUNTER — Ambulatory Visit: Payer: No Typology Code available for payment source

## 2022-01-09 ENCOUNTER — Ambulatory Visit
Admission: RE | Admit: 2022-01-09 | Discharge: 2022-01-09 | Disposition: A | Discharge status: Home / Self Care | Payer: No Typology Code available for payment source | Source: Ambulatory Visit | Attending: Radiation Oncology | Admitting: Radiation Oncology

## 2022-01-09 ENCOUNTER — Other Ambulatory Visit: Payer: Self-pay

## 2022-01-09 LAB — RAD ONC ARIA SESSION SUMMARY
Course Elapsed Days: 56
Plan Fractions Treated to Date: 14
Plan Prescribed Dose Per Fraction: 2 Gy
Plan Total Fractions Prescribed: 15
Plan Total Prescribed Dose: 30 Gy
Reference Point Dosage Given to Date: 73 Gy
Reference Point Session Dosage Given: 2 Gy
Session Number: 39

## 2022-01-10 ENCOUNTER — Encounter: Payer: Self-pay | Admitting: Urology

## 2022-01-10 ENCOUNTER — Ambulatory Visit
Admission: RE | Admit: 2022-01-10 | Discharge: 2022-01-10 | Disposition: A | Discharge status: Home / Self Care | Payer: No Typology Code available for payment source | Source: Ambulatory Visit | Attending: Radiation Oncology | Admitting: Radiation Oncology

## 2022-01-10 ENCOUNTER — Other Ambulatory Visit: Payer: Self-pay

## 2022-01-10 DIAGNOSIS — C61 Malignant neoplasm of prostate: Secondary | ICD-10-CM

## 2022-01-10 LAB — RAD ONC ARIA SESSION SUMMARY
Course Elapsed Days: 57
Plan Fractions Treated to Date: 15
Plan Prescribed Dose Per Fraction: 2 Gy
Plan Total Fractions Prescribed: 15
Plan Total Prescribed Dose: 30 Gy
Reference Point Dosage Given to Date: 75 Gy
Reference Point Session Dosage Given: 2 Gy
Session Number: 40

## 2022-02-15 ENCOUNTER — Encounter: Payer: Self-pay | Admitting: Urology

## 2022-02-15 NOTE — Progress Notes (Signed)
Telephone appointment. I verified patient's identity and began nursing interview. No issues reported at this time.  Meaningful use complete. I-PSS score of 3-mild. Flomax as directed.  Urology appt- May 06, 2022  Reminded patient of their 8:30am-02/20/22 telephone appointment w/ Ashlyn Bruning PA-C. I left my extension [phone] in case patient needs anything. Patient verbalized understanding.  Patient contact [phone]

## 2022-02-20 ENCOUNTER — Ambulatory Visit
Admission: RE | Admit: 2022-02-20 | Discharge: 2022-02-20 | Disposition: A | Discharge status: Home / Self Care | Payer: Self-pay | Source: Ambulatory Visit | Attending: Urology | Admitting: Urology

## 2022-02-20 DIAGNOSIS — C61 Malignant neoplasm of prostate: Secondary | ICD-10-CM

## 2022-02-20 NOTE — Progress Notes (Signed)
Radiation Oncology         ([phone]) [phone] ________________________________  Name: Jerome Newton MRN: [id number]  Date: 02/20/2022  DOB: 1960-09-17  Post Treatment Note  CC: Crist Infante, MD  Irine Seal, MD  Diagnosis:   61 y.o. gentleman with stage T1c adenocarcinoma of the prostate with a Gleason's score of 3+3 from TRUSPBx in 03/2019 and a current PSA of 15.2  Interval Since Last Radiation:  6 weeks (concurrent with ADT)  11/14/21 - 01/10/22: 1. The prostate, seminal vesicles, and pelvic lymph nodes were initially treated to 45 Gy in 25 fractions of 1.8 Gy  2. The prostate only was boosted to 75 Gy with 15 additional fractions of 2.0 Gy   Narrative:  I spoke with the patient to conduct his routine scheduled 1 month follow up visit via telephone to spare the patient unnecessary potential exposure in the healthcare setting during the current COVID-19 pandemic.  The patient was notified in advance and gave permission to proceed with this visit format.  He tolerated radiation treatment relatively well with only minor urinary irritation and modest fatigue.  He reported increased nocturia 4x/night despite taking Flomax but otherwise, was without complaints.                              On review of systems, the patient states that he is doing well in general.  He continues with mild increased nocturia, urgency and frequency but feels that this continues to improve gradually.  He has continued taking Flomax daily as prescribed.  He specifically denies dysuria, gross hematuria, straining to void, incomplete bladder emptying or incontinence.  He reports a healthy appetite and is maintaining his weight.  He denies any abdominal pain, nausea, vomiting, diarrhea or constipation.  He continues to tolerate the ADT fairly well despite hot flashes and decreased stamina.  His PSA on 01/21/2022, prior to his most recent follow-up with Dr. [name] on 01/30/2022, was 0.39 and overall, he is pleased with his progress  today.  ALLERGIES:  is allergic to lisinopril and pravastatin.  Meds: Current Outpatient Medications  Medication Sig Dispense Refill   acetaminophen (TYLENOL) 325 MG tablet Take 650 mg by mouth every 6 (six) hours as needed.     albuterol (VENTOLIN HFA) 108 (90 Base) MCG/ACT inhaler Inhale into the lungs every 6 (six) hours as needed for wheezing or shortness of breath.     Candesartan Cilexetil (ATACAND PO) Take 8 mg by mouth daily.     Cyanocobalamin (VITAMIN B 12 PO) Take by mouth.     Garlic 9379 MG TBEC Take 2,000 mg by mouth daily.     Ginkgo Biloba 40 MG TABS Take by mouth.     ibuprofen (ADVIL,MOTRIN) 400 MG tablet Take 400 mg by mouth every 6 (six) hours as needed.     loratadine (CLARITIN) 10 MG tablet Take 10 mg by mouth daily.     LYSINE PO Take 100 mg by mouth daily.     Multiple Vitamins-Minerals (MULTIVITAMIN WITH MINERALS) tablet Take 1 tablet by mouth daily.     Omega-3 Fatty Acids (FISH OIL) 1000 MG CAPS Take 1,200 mg by mouth 2 (two) times daily.     OVER THE COUNTER MEDICATION Calcium with vitamin b 3     pravastatin (PRAVACHOL) 20 MG tablet Take 40 mg by mouth daily.     Red Yeast Rice Extract (RED YEAST RICE PO) Take by mouth daily.  tamsulosin (FLOMAX) 0.4 MG CAPS capsule Take 1 capsule (0.4 mg total) by mouth daily after supper. 30 capsule 5   Triamcinolone Acetonide (NASACORT ALLERGY 24HR NA) Place into the nose daily.     No current facility-administered medications for this encounter.    Physical Findings:  vitals were not taken for this visit.  Pain Assessment Pain Score: 0-No pain/10 Unable to assess due to telephone follow up visit format.   Lab Findings: Lab Results  Component Value Date   WBC 15.1 (A) 12/06/2011   HGB 16.0 10/26/2021   HCT 47.0 10/26/2021   MCV 100.5 (A) 12/06/2011     Radiographic Findings: No results found.  Impression/Plan: 1. 61 y.o. gentleman with stage T1c adenocarcinoma of the prostate with a Gleason's score of  3+3 from TRUSPBx in 03/2019 and a current PSA of 15.2. He will continue to follow up with urology for ongoing PSA determinations and has an appointment scheduled with Dr. [name] on 05/06/22. He understands what to expect with regards to PSA monitoring going forward. I will look forward to following his response to treatment via correspondence with urology, and would be happy to continue to participate in his care if clinically indicated. I talked to the patient about what to expect in the future, including his risk for erectile dysfunction and rectal bleeding. I encouraged him to call or return to the office if he has any questions regarding his previous radiation or possible radiation side effects. He was comfortable with this plan and will follow up as needed.      Nicholos Johns, PA-C

## 2022-02-20 NOTE — Progress Notes (Signed)
  Radiation Oncology         [phone]) [phone] ________________________________  Name: ROREY BISSON MRN: [id number]  Date: 01/10/2022  DOB: [birth year]  End of Treatment Note  Diagnosis:   61 y.o. gentleman with stage T1c adenocarcinoma of the prostate with a Gleason's score of 3+3 and a PSA of 15.2     Indication for treatment:  Curative, Definitive Radiotherapy concurrent with ADT      Radiation treatment dates:   11/14/21 - 01/10/22  Site/dose:  1. The prostate, seminal vesicles, and pelvic lymph nodes were initially treated to 45 Gy in 25 fractions of 1.8 Gy  2. The prostate only was boosted to 75 Gy with 15 additional fractions of 2.0 Gy   Beams/energy:  1. The prostate, seminal vesicles, and pelvic lymph nodes were initially treated using VMAT intensity modulated radiotherapy delivering 6 megavolt photons. Image guidance was performed with CB-CT studies prior to each fraction. He was immobilized with a body fix lower extremity mold.  2. the prostate only was boosted using VMAT intensity modulated radiotherapy delivering 6 megavolt photons. Image guidance was performed with CB-CT studies prior to each fraction. He was immobilized with a body fix lower extremity mold.  Narrative: The patient tolerated radiation treatment relatively well with only minor urinary irritation and modest fatigue.  He reported increased nocturia 4x/night despite taking Flomax but otherwise, was without complaints.  Plan: The patient has completed radiation treatment. He will return to radiation oncology clinic for routine followup in one month. I advised him to call or return sooner if he has any questions or concerns related to his recovery or treatment. ________________________________  Sheral Apley. Tammi Klippel, M.D.

## 2022-03-19 ENCOUNTER — Encounter: Payer: Self-pay | Admitting: *Deleted

## 2022-03-26 ENCOUNTER — Encounter: Payer: Self-pay | Admitting: *Deleted

## 2022-04-08 ENCOUNTER — Inpatient Hospital Stay: Payer: Self-pay | Attending: Oncology | Admitting: *Deleted

## 2022-04-08 DIAGNOSIS — C61 Malignant neoplasm of prostate: Secondary | ICD-10-CM

## 2022-04-09 ENCOUNTER — Encounter: Payer: Self-pay | Admitting: *Deleted

## 2022-04-09 NOTE — Progress Notes (Signed)
2 Identifiers were used for verification purposes. No vitals were taken as this was a telephone visit. Pt denies pain today. He states that he still has decreased stamina and low energy from the ADT Therapy. He continues to have hotflashes but is tolerating them. Pt states he gets up to bathroom approx 3 times nightly. Daytime urgency is getting better. No changes in bowels. Urine flow is strong. Pt states he does have ED. Pt does not take the flu vaccine nor Covid vaccine. 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 He denies smoking and drinks rarely. We discussed diet, pt says he eats a lot of red meat but will tried to limit that in the future.He will see PCP in the next  month. Next urology appt is in October for PSA determinations. SCP reviewed and completed.

## 2022-04-16 ENCOUNTER — Other Ambulatory Visit: Payer: Self-pay | Admitting: Internal Medicine

## 2022-04-16 DIAGNOSIS — I712 Thoracic aortic aneurysm, without rupture, unspecified: Secondary | ICD-10-CM

## 2022-05-14 ENCOUNTER — Ambulatory Visit
Admission: RE | Admit: 2022-05-14 | Discharge: 2022-05-14 | Disposition: A | Discharge status: Home / Self Care | Payer: No Typology Code available for payment source | Source: Ambulatory Visit | Attending: Internal Medicine | Admitting: Internal Medicine

## 2022-05-14 DIAGNOSIS — I712 Thoracic aortic aneurysm, without rupture, unspecified: Secondary | ICD-10-CM

## 2022-05-14 MED ORDER — IOPAMIDOL (ISOVUE-370) INJECTION 76%
75.0000 mL | Freq: Once | INTRAVENOUS | Status: AC | PRN
  Administered 2022-05-14: 75 mL via INTRAVENOUS

## 2022-06-01 ENCOUNTER — Other Ambulatory Visit: Payer: Self-pay | Admitting: Radiation Oncology

## 2022-11-18 ENCOUNTER — Other Ambulatory Visit: Payer: Self-pay | Admitting: Internal Medicine

## 2022-11-18 DIAGNOSIS — I7121 Aneurysm of the ascending aorta, without rupture: Secondary | ICD-10-CM

## 2022-11-24 ENCOUNTER — Other Ambulatory Visit: Payer: Self-pay | Admitting: Radiation Oncology

## 2022-12-20 IMAGING — PT NM PET TUM IMG SKULL BASE T - THIGH
1 series · 6 of 6 positions shown · non-contrast
Comparison: Prostate MR of 11/23/2020.

CLINICAL DATA: Prostate cancer with rising PSA and pelvic
adenopathy. PSA of 15.2.

EXAM:
NUCLEAR MEDICINE PET SKULL BASE TO THIGH
TECHNIQUE: 9.1 mCi F18 Piflufolastat (Pylarify) was injected intravenously.
Full-ring PET imaging was performed from the skull base to thigh
after the radiotracer. CT data was obtained and used for attenuation
correction and anatomic localization.

[Series 1088: results mm oncology reading · 3.0mm · 0.94mm/px · 6 of 6 slices shown]
[im 1/6]
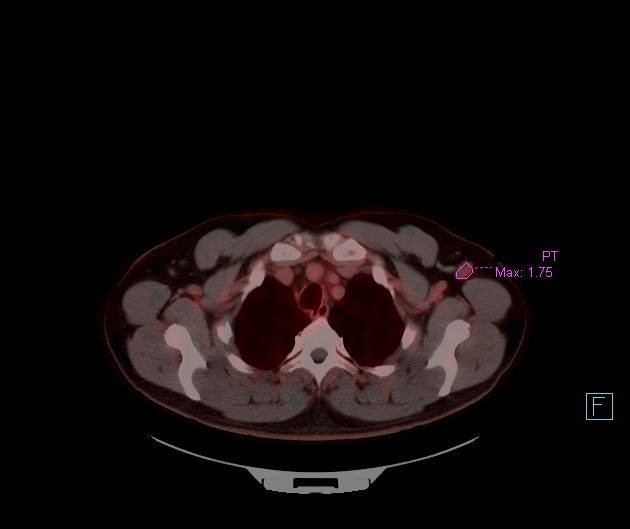
[im 2/6]
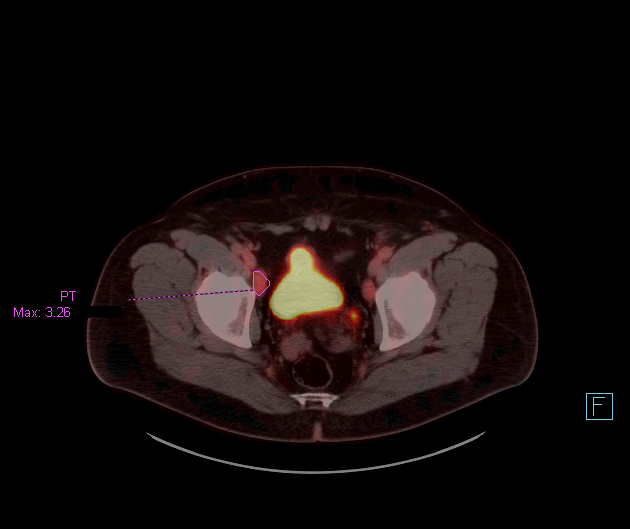
[im 3/6]
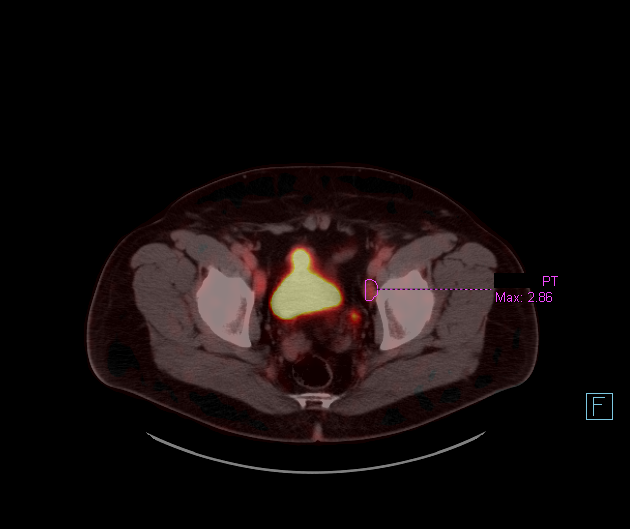
[im 4/6]
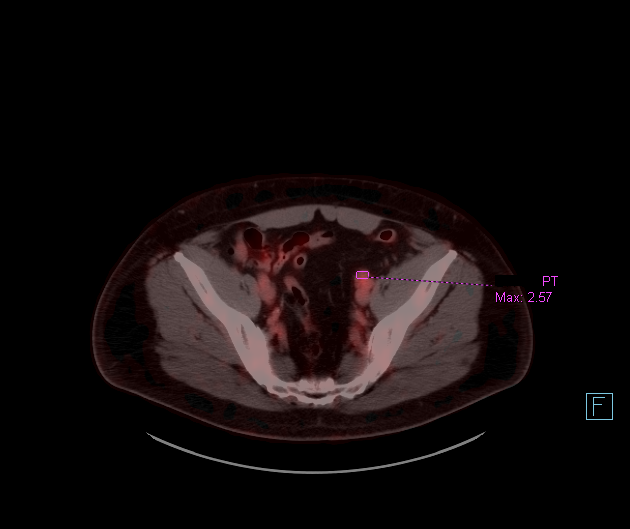
[im 5/6]
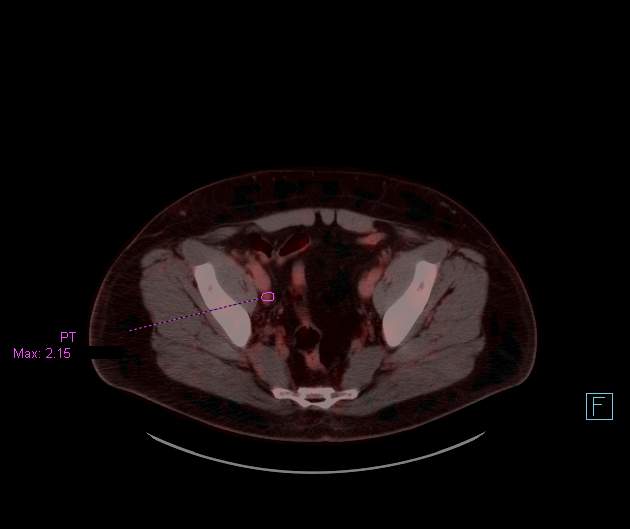
[im 6/6]
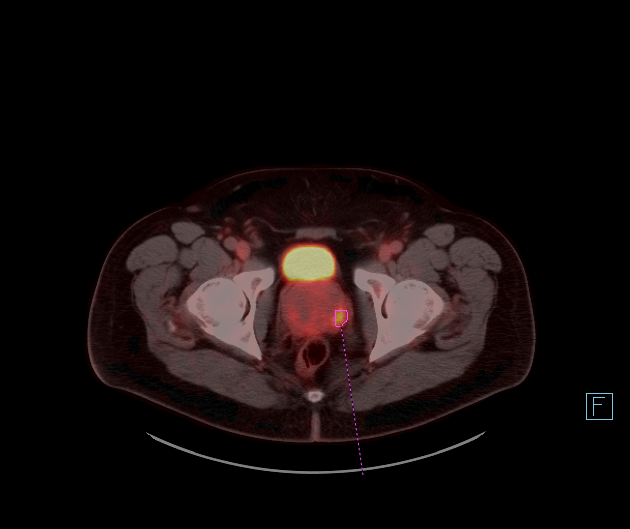

[6 of 6 positions shown; findings below may reference images not displayed]

FINDINGS: NECK

No radiotracer activity in neck lymph nodes.

Incidental CT finding: Mucosal thickening right maxillary sinus. No
cervical adenopathy. Multiple small bilateral posterior triangle
nodes.

CHEST

A left axillary node measures 1.1 cm and a S.U.V. max of 1.8 on
79/4, favored to be reactive. No mediastinal or hilar nodal
hypermetabolism. No pulmonary parenchymal hypermetabolism.

Incidental CT finding: Borderline ascending aortic dilatation,
including at 4.0 cm on 94/4. Tortuous thoracic aorta. Mild
cardiomegaly.

ABDOMEN/PELVIS

Prostate: Left-sided prostatic tracer uptake involving the mid gland
peripheral zone measures a S.U.V. 5.9 and is without correlate on
prior MRI.

Lymph nodes: No abdominal nodal tracer uptake.

Right external iliac node measures 1.3 cm and a S.U.V. max of 3.3 on
224/4.

Left external iliac node measures 1.1 cm and a S.U.V. max of 2.9 on
225/4.

Left external iliac node more cephalad measures 7 mm and a S.U.V.
max of 2.6 on [DATE].

More cephalad right external iliac node measures 8 mm and a S.U.V.
max of 2.2 on 216/4.

Liver: No evidence of liver metastasis

Incidental CT finding: Hepatic steatosis. Segment 4A 1 cm hepatic
cyst. Multiple dependent gallstones. Normal adrenal glands. Mild
renal cortical thinning bilaterally. Abdominal aortic
atherosclerosis. Mild prostatomegaly. Fat containing left inguinal
hernia.

SKELETON

No focal  activity to suggest skeletal metastasis.

Degenerative partial fusion of the left sacroiliac joint. Moderate
bilateral gynecomastia.
IMPRESSION: 1. Isolated tracer avid mild pelvic adenopathy, highly suspicious
for nodal metastasis.
2. Left-sided prostate tracer uptake, without correlate on prior
MRI. Favored to be physiologic.
3. No evidence of tracer avid extrapelvic metastasis.
4. Borderline ascending aortic dilatation, 4.0 cm. Recommend annual
imaging followup by CTA or MRA. This recommendation follows 4414
ACCF/AHA/AATS/ACR/ASA/SCA/DEYVSON/REZAUL/OIK/YASURO Guidelines for the
Diagnosis and Management of Patients with Thoracic Aortic Disease.
Circulation. 4414; 121: [id number]. Aortic aneurysm NOS ([id number]-[id number].T)
5. Incidental findings, including: Hepatic steatosis.
Cholelithiasis. Bilateral gynecomastia. Aortic Atherosclerosis
([id number]).

## 2022-12-22 ENCOUNTER — Other Ambulatory Visit: Payer: Self-pay | Admitting: Radiation Oncology

## 2022-12-27 ENCOUNTER — Ambulatory Visit
Admission: RE | Admit: 2022-12-27 | Discharge: 2022-12-27 | Disposition: A | Discharge status: Home / Self Care | Payer: Self-pay | Source: Ambulatory Visit | Attending: Internal Medicine | Admitting: Internal Medicine

## 2022-12-27 DIAGNOSIS — I7121 Aneurysm of the ascending aorta, without rupture: Secondary | ICD-10-CM

## 2022-12-27 MED ORDER — IOPAMIDOL (ISOVUE-370) INJECTION 76%
75.0000 mL | Freq: Once | INTRAVENOUS | Status: AC | PRN
  Administered 2022-12-27: 75 mL via INTRAVENOUS

## 2023-01-30 ENCOUNTER — Other Ambulatory Visit: Payer: Self-pay | Admitting: Radiation Oncology

## 2023-12-31 DIAGNOSIS — C61 Malignant neoplasm of prostate: Secondary | ICD-10-CM | POA: Diagnosis not present

## 2023-12-31 DIAGNOSIS — F900 Attention-deficit hyperactivity disorder, predominantly inattentive type: Secondary | ICD-10-CM | POA: Diagnosis not present

## 2023-12-31 DIAGNOSIS — M8580 Other specified disorders of bone density and structure, unspecified site: Secondary | ICD-10-CM | POA: Diagnosis not present

## 2023-12-31 DIAGNOSIS — Z789 Other specified health status: Secondary | ICD-10-CM | POA: Diagnosis not present

## 2023-12-31 DIAGNOSIS — E118 Type 2 diabetes mellitus with unspecified complications: Secondary | ICD-10-CM | POA: Diagnosis not present

## 2023-12-31 DIAGNOSIS — J45998 Other asthma: Secondary | ICD-10-CM | POA: Diagnosis not present

## 2023-12-31 DIAGNOSIS — E785 Hyperlipidemia, unspecified: Secondary | ICD-10-CM | POA: Diagnosis not present

## 2023-12-31 DIAGNOSIS — I440 Atrioventricular block, first degree: Secondary | ICD-10-CM | POA: Diagnosis not present

## 2023-12-31 DIAGNOSIS — H9190 Unspecified hearing loss, unspecified ear: Secondary | ICD-10-CM | POA: Diagnosis not present

## 2023-12-31 DIAGNOSIS — J302 Other seasonal allergic rhinitis: Secondary | ICD-10-CM | POA: Diagnosis not present

## 2023-12-31 DIAGNOSIS — I10 Essential (primary) hypertension: Secondary | ICD-10-CM | POA: Diagnosis not present

## 2023-12-31 DIAGNOSIS — I712 Thoracic aortic aneurysm, without rupture, unspecified: Secondary | ICD-10-CM | POA: Diagnosis not present

## 2024-02-04 ENCOUNTER — Other Ambulatory Visit: Payer: Self-pay | Admitting: Internal Medicine

## 2024-02-04 DIAGNOSIS — I712 Thoracic aortic aneurysm, without rupture, unspecified: Secondary | ICD-10-CM

## 2024-02-10 ENCOUNTER — Ambulatory Visit
Admission: RE | Admit: 2024-02-10 | Discharge: 2024-02-10 | Disposition: A | Discharge status: Home / Self Care | Payer: Self-pay | Source: Ambulatory Visit | Attending: Internal Medicine | Admitting: Internal Medicine

## 2024-02-10 DIAGNOSIS — I7121 Aneurysm of the ascending aorta, without rupture: Secondary | ICD-10-CM | POA: Diagnosis not present

## 2024-02-10 DIAGNOSIS — I712 Thoracic aortic aneurysm, without rupture, unspecified: Secondary | ICD-10-CM

## 2024-02-10 MED ORDER — IOPAMIDOL (ISOVUE-370) INJECTION 76%
80.0000 mL | Freq: Once | INTRAVENOUS | Status: AC | PRN
  Administered 2024-02-10: 80 mL via INTRAVENOUS

## 2024-05-12 DIAGNOSIS — R399 Unspecified symptoms and signs involving the genitourinary system: Secondary | ICD-10-CM | POA: Diagnosis not present

## 2024-05-12 DIAGNOSIS — R9721 Rising PSA following treatment for malignant neoplasm of prostate: Secondary | ICD-10-CM | POA: Diagnosis not present

## 2024-05-12 DIAGNOSIS — N5201 Erectile dysfunction due to arterial insufficiency: Secondary | ICD-10-CM | POA: Diagnosis not present

## 2024-05-12 DIAGNOSIS — E291 Testicular hypofunction: Secondary | ICD-10-CM | POA: Diagnosis not present

## 2024-05-12 DIAGNOSIS — C61 Malignant neoplasm of prostate: Secondary | ICD-10-CM | POA: Diagnosis not present
